# Patient Record
Sex: Male | Born: 1950 | Race: White | Hispanic: No | Marital: Married | Smoking: Never smoker
Health system: Southern US, Community
[De-identification: ages and names within clinical notes are randomized; demographics above are authoritative.]

## PROBLEM LIST (undated history)

## (undated) DIAGNOSIS — E119 Type 2 diabetes mellitus without complications: Secondary | ICD-10-CM

## (undated) DIAGNOSIS — E785 Hyperlipidemia, unspecified: Secondary | ICD-10-CM

## (undated) DIAGNOSIS — I10 Essential (primary) hypertension: Secondary | ICD-10-CM

## (undated) HISTORY — DX: Essential (primary) hypertension: I10

## (undated) HISTORY — DX: Hyperlipidemia, unspecified: E78.5

## (undated) HISTORY — PX: ARM DEBRIDEMENT: SHX890

## (undated) HISTORY — DX: Type 2 diabetes mellitus without complications: E11.9

---

## 2005-03-02 ENCOUNTER — Encounter: Admission: RE | Admit: 2005-03-02 | Discharge: 2005-03-02 | Payer: Self-pay | Admitting: Sports Medicine

## 2005-03-29 ENCOUNTER — Encounter: Admission: RE | Admit: 2005-03-29 | Discharge: 2005-03-29 | Payer: Self-pay | Admitting: Orthopedic Surgery

## 2005-04-15 ENCOUNTER — Encounter: Admission: RE | Admit: 2005-04-15 | Discharge: 2005-04-15 | Payer: Self-pay | Admitting: Orthopedic Surgery

## 2007-04-03 ENCOUNTER — Encounter: Admission: RE | Admit: 2007-04-03 | Discharge: 2007-04-03 | Payer: Self-pay | Admitting: Sports Medicine

## 2007-04-18 ENCOUNTER — Encounter: Admission: RE | Admit: 2007-04-18 | Discharge: 2007-04-18 | Payer: Self-pay | Admitting: Sports Medicine

## 2007-11-21 ENCOUNTER — Encounter: Admission: RE | Admit: 2007-11-21 | Discharge: 2007-11-21 | Payer: Self-pay | Admitting: Sports Medicine

## 2007-12-05 ENCOUNTER — Encounter: Admission: RE | Admit: 2007-12-05 | Discharge: 2007-12-05 | Payer: Self-pay | Admitting: Sports Medicine

## 2007-12-26 ENCOUNTER — Encounter: Admission: RE | Admit: 2007-12-26 | Discharge: 2007-12-26 | Payer: Self-pay | Admitting: Sports Medicine

## 2008-01-23 ENCOUNTER — Encounter: Admission: RE | Admit: 2008-01-23 | Discharge: 2008-01-23 | Payer: Self-pay | Admitting: Sports Medicine

## 2008-12-12 ENCOUNTER — Emergency Department (HOSPITAL_COMMUNITY): Admission: EM | Admit: 2008-12-12 | Discharge: 2008-12-12 | Payer: Self-pay | Admitting: Emergency Medicine

## 2009-08-27 IMAGING — CR DG CHEST 1V
1 series · 1 of 1 positions shown · non-contrast
Comparison: None

CLINICAL DATA: Dizziness

CHEST - 1 VIEW

[view not recorded]
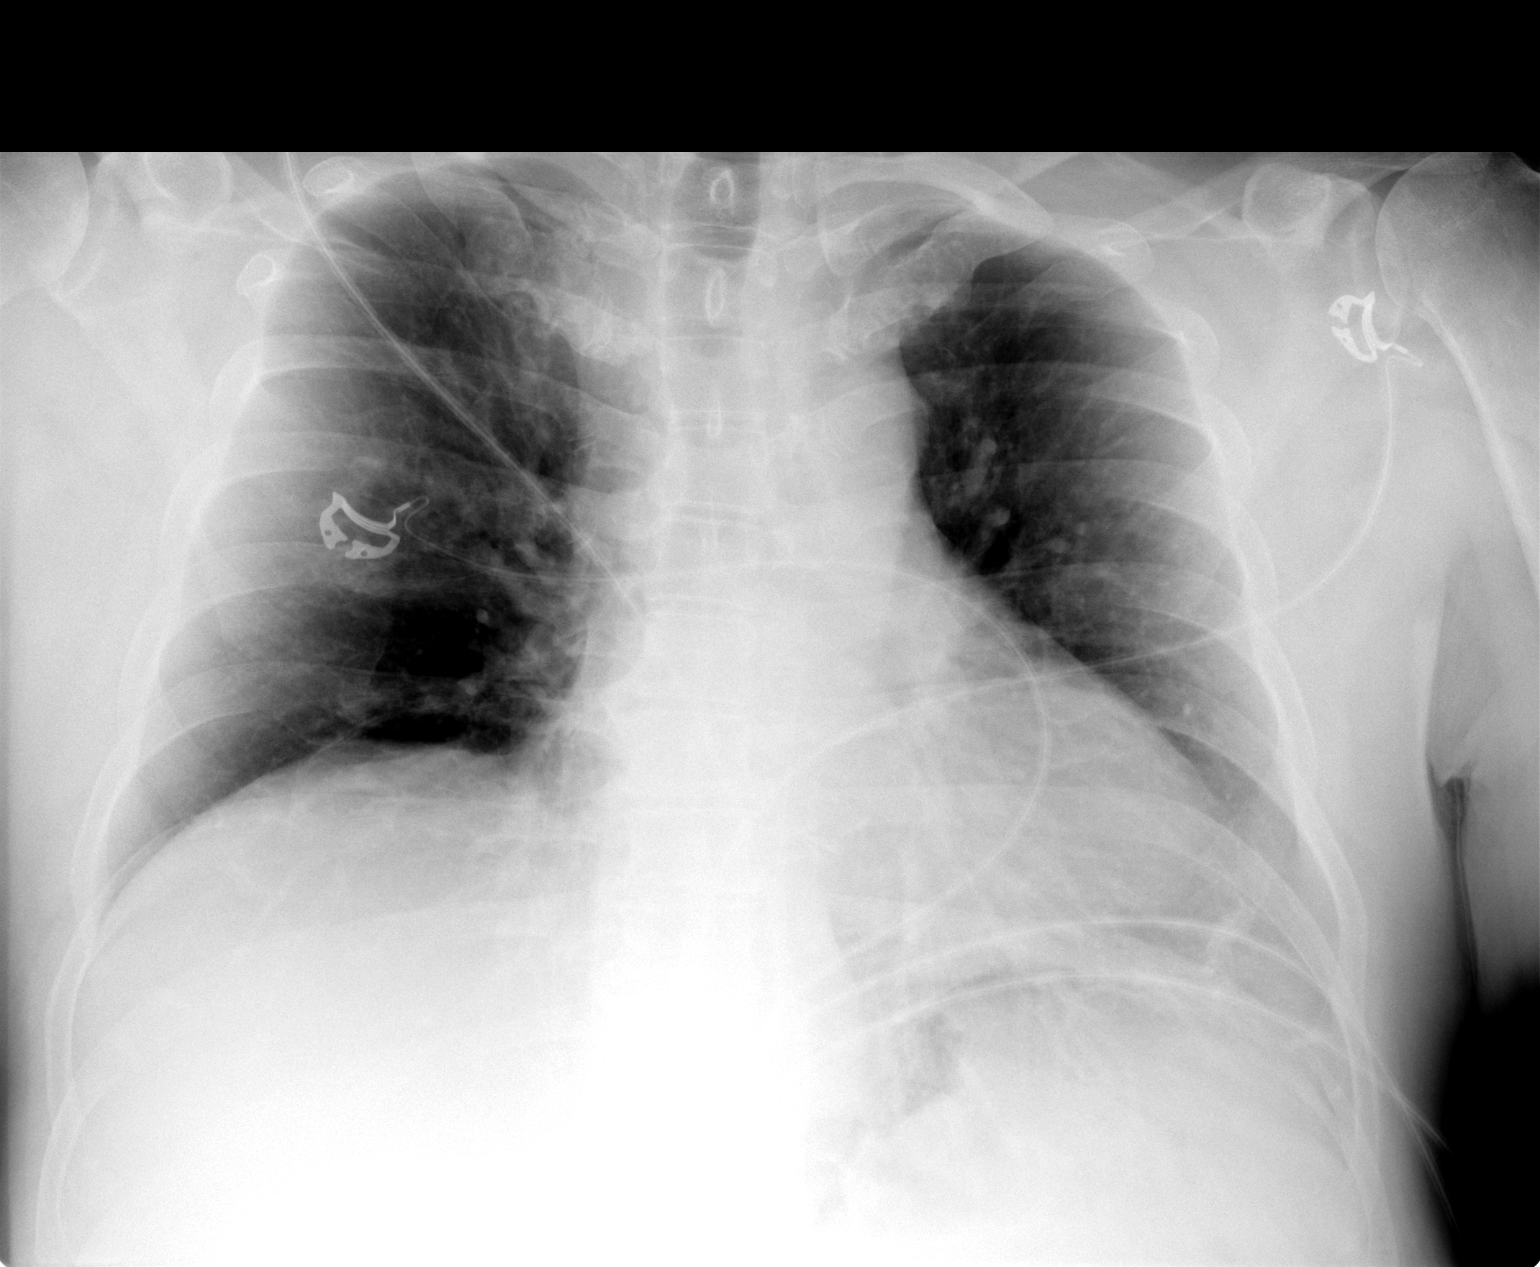

[1 of 1 positions shown; findings below may reference images not displayed]

FINDINGS: The heart size and mediastinal contours are within normal
limits.  Both lungs are clear.  The visualized skeletal structures
are unremarkable.
IMPRESSION: No acute findings.

## 2009-08-27 IMAGING — CT CT HEAD W/O CM
1 series · 16 of 30 positions shown, 20 images · non-contrast
Comparison: None

CLINICAL DATA: Dizziness.

CT HEAD WITHOUT CONTRAST
TECHNIQUE: Contiguous axial images were obtained from the base of
the skull through the vertex without contrast

[Series 2: headseq 4.8 h37s · axial · 0.50mm/px · z∈[+93,+251]mm · 16 of 36 slices shown, 20 images]
[im 2/36  brain]
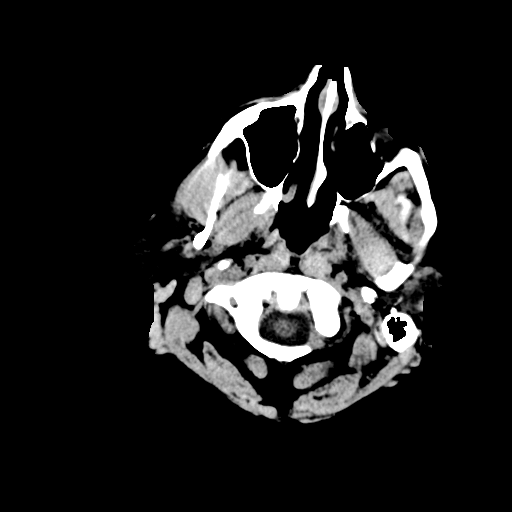
[im 2/36  bone]
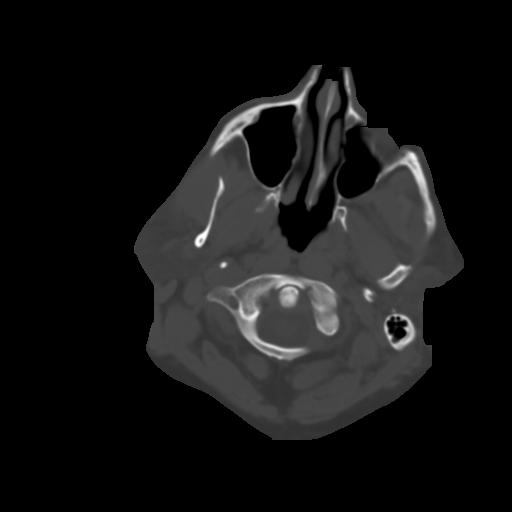
[im 4/36  brain]
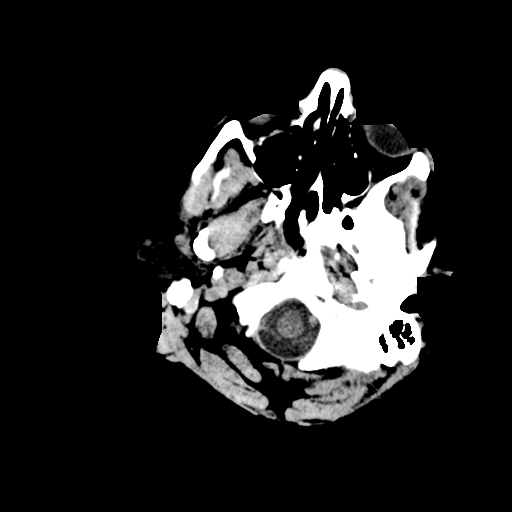
[im 7/36  brain]
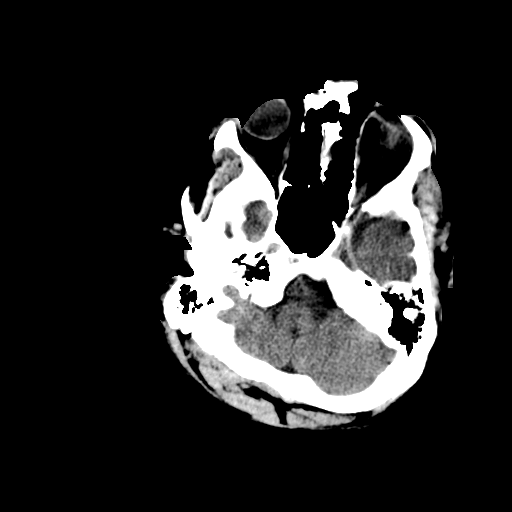
[im 9/36  brain]
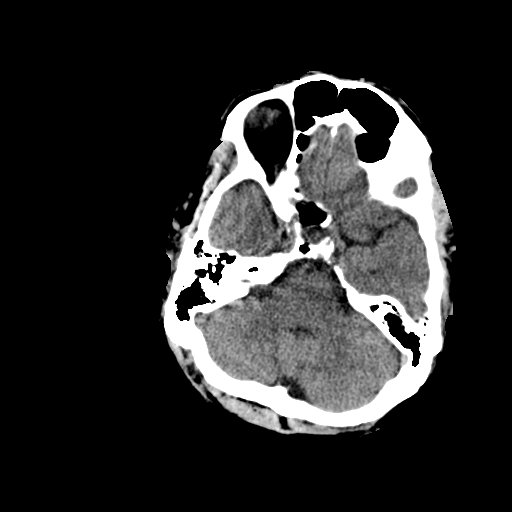
[im 10/36  brain]
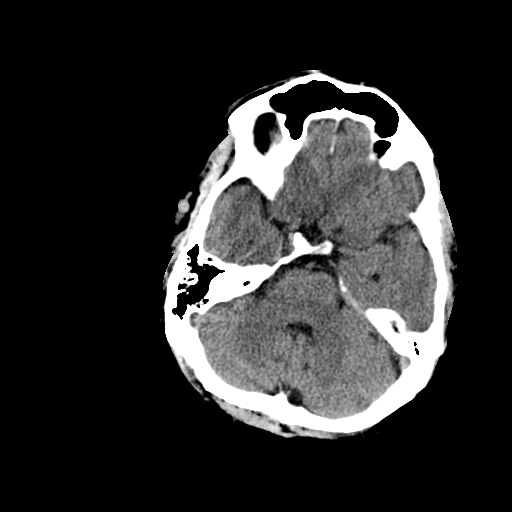
[im 10/36  bone]
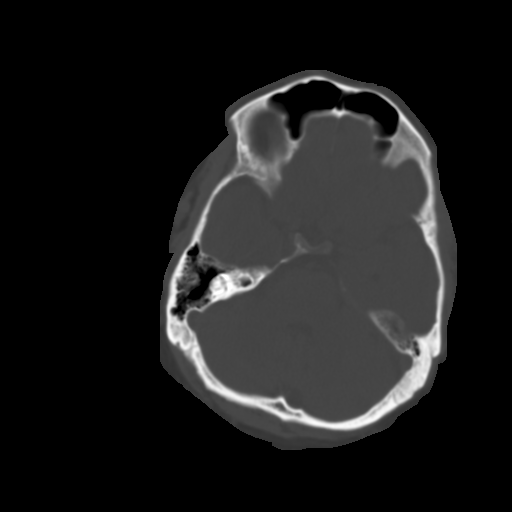
[im 13/36  brain]
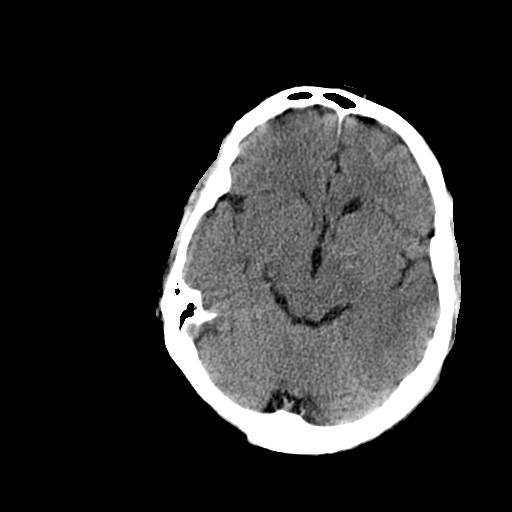
[im 15/36  brain]
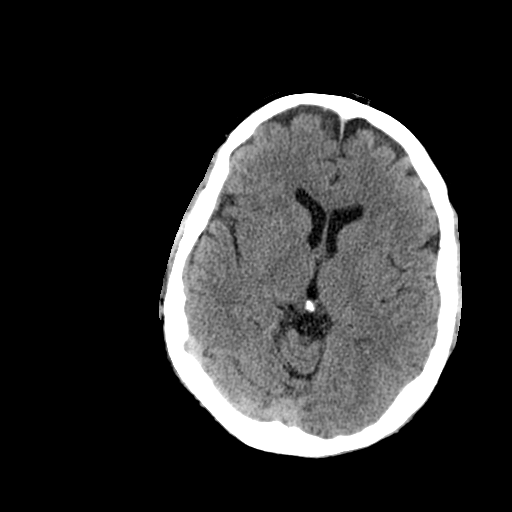
[im 17/36  brain]
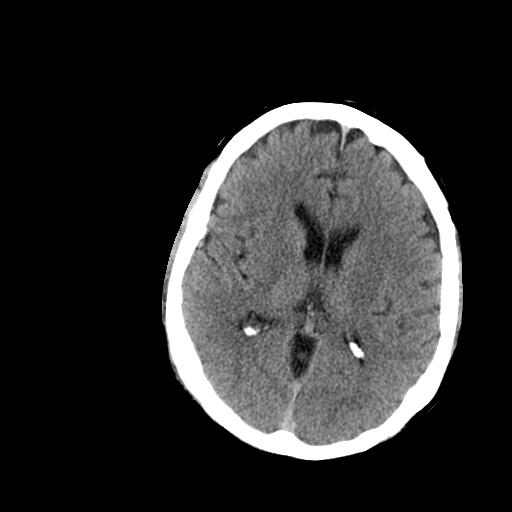
[im 19/36  brain]
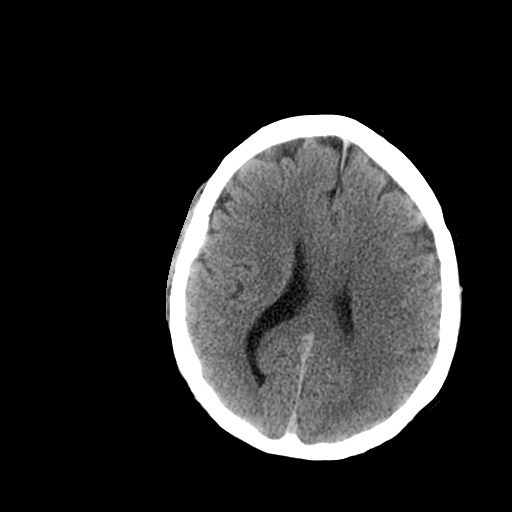
[im 19/36  bone]
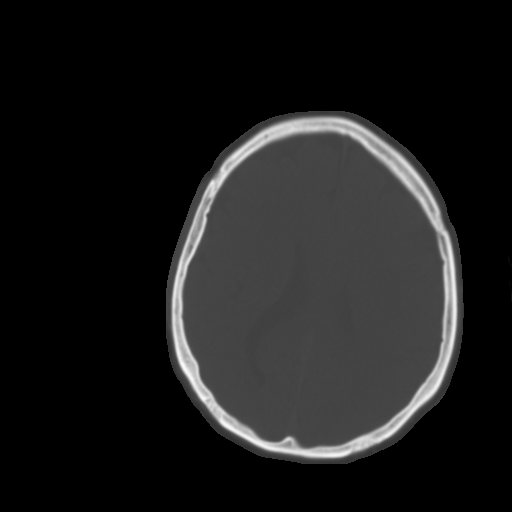
[im 21/36  brain]
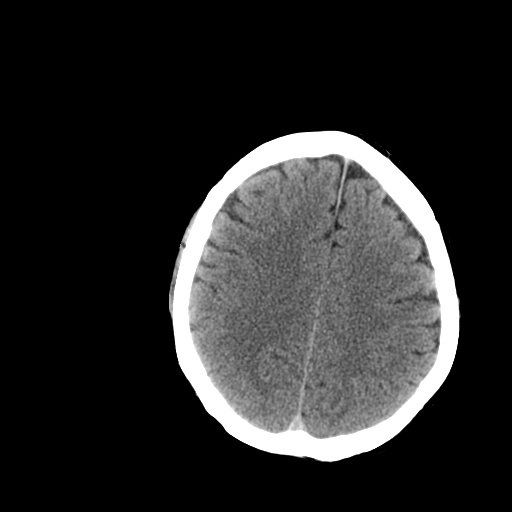
[im 23/36  brain]
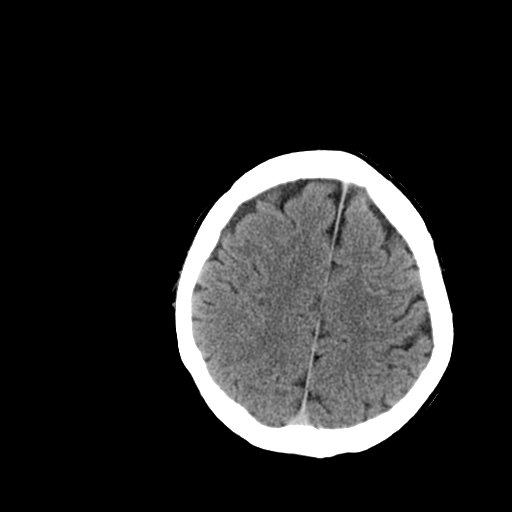
[im 26/36  brain]
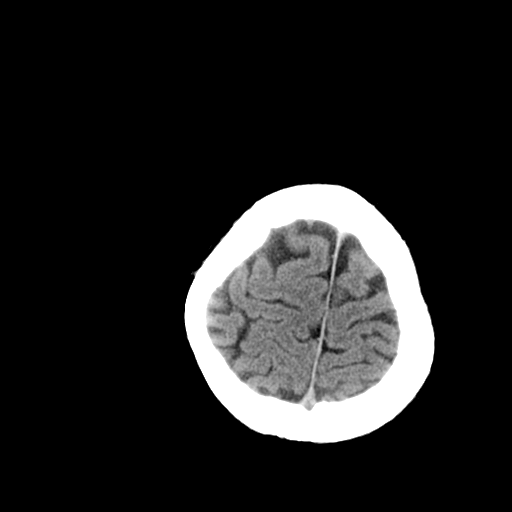
[im 27/36  brain]
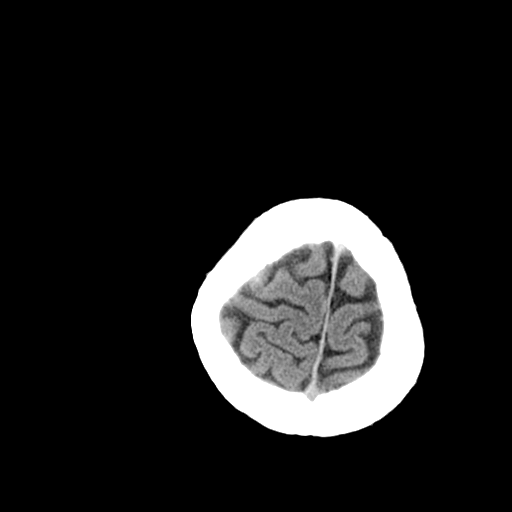
[im 27/36  bone]
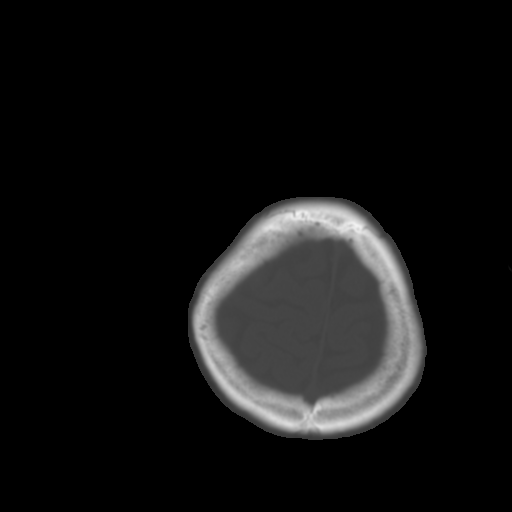
[im 29/36  brain]
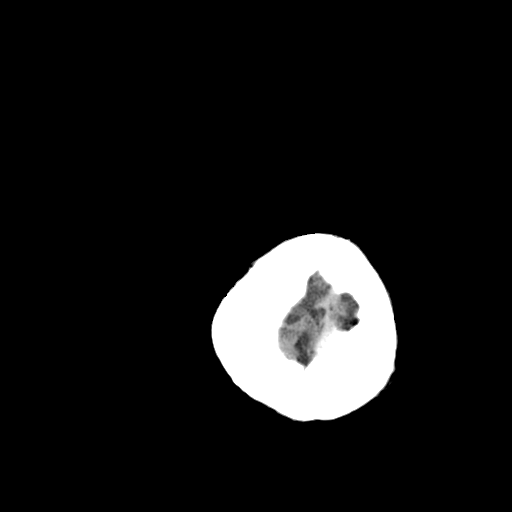
[im 32/36  brain]
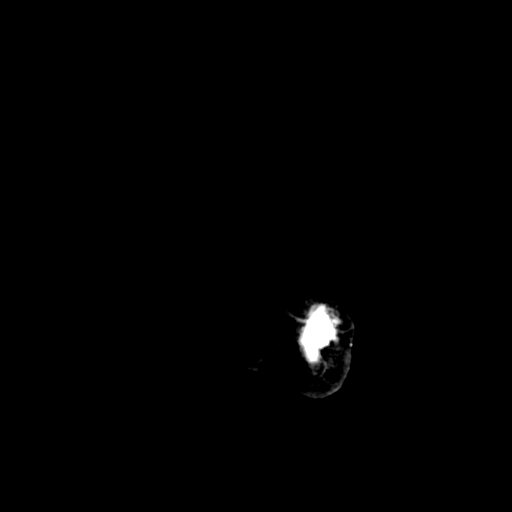
[im 34/36  brain]
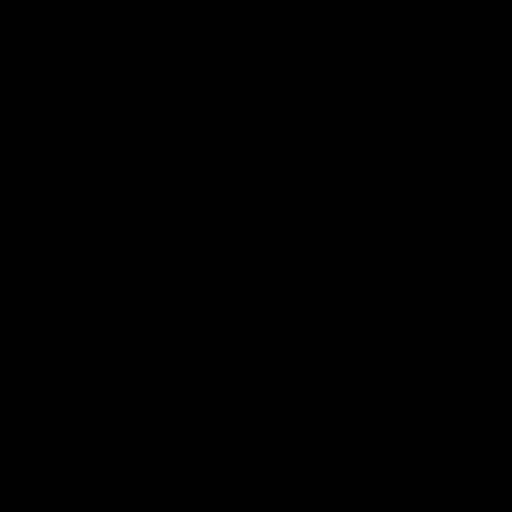

[16 of 30 positions shown; findings below may reference images not displayed]

FINDINGS: The brain has a normal appearance without evidence for
hemorrhage, acute infarction, hydrocephalus, or mass lesion.  There
is no extra axial fluid collection.  The skull and paranasal
sinuses are normal.
IMPRESSION: Normal CT of the head without contrast.

## 2010-08-21 ENCOUNTER — Encounter: Payer: Self-pay | Admitting: Orthopedic Surgery

## 2010-08-22 ENCOUNTER — Encounter: Payer: Self-pay | Admitting: Sports Medicine

## 2010-11-09 LAB — COMPREHENSIVE METABOLIC PANEL
ALT: 26 U/L (ref 0–53)
AST: 21 U/L (ref 0–37)
Alkaline Phosphatase: 81 U/L (ref 39–117)
Calcium: 9.4 mg/dL (ref 8.4–10.5)
Creatinine, Ser: 1.05 mg/dL (ref 0.4–1.5)
Glucose, Bld: 141 mg/dL — ABNORMAL HIGH (ref 70–99)
Sodium: 136 mEq/L (ref 135–145)
Total Bilirubin: 0.6 mg/dL (ref 0.3–1.2)
Total Protein: 7 g/dL (ref 6.0–8.3)

## 2010-11-09 LAB — CBC
HCT: 46.9 % (ref 39.0–52.0)
MCHC: 35.3 g/dL (ref 30.0–36.0)
Platelets: 212 10*3/uL (ref 150–400)
RBC: 5.55 MIL/uL (ref 4.22–5.81)
RDW: 13.7 % (ref 11.5–15.5)

## 2010-11-09 LAB — DIFFERENTIAL
Basophils Relative: 1 % (ref 0–1)
Eosinophils Relative: 2 % (ref 0–5)
Lymphs Abs: 3.6 10*3/uL (ref 0.7–4.0)

## 2010-11-09 LAB — BRAIN NATRIURETIC PEPTIDE: Pro B Natriuretic peptide (BNP): <30 pg/mL (ref 0.0–100.0)

## 2010-11-09 LAB — POCT CARDIAC MARKERS

## 2012-02-22 DIAGNOSIS — N529 Male erectile dysfunction, unspecified: Secondary | ICD-10-CM | POA: Insufficient documentation

## 2017-09-12 DIAGNOSIS — N401 Enlarged prostate with lower urinary tract symptoms: Secondary | ICD-10-CM | POA: Insufficient documentation

## 2023-03-02 ENCOUNTER — Encounter: Payer: Self-pay | Admitting: Family Medicine

## 2023-03-02 ENCOUNTER — Ambulatory Visit (INDEPENDENT_AMBULATORY_CARE_PROVIDER_SITE_OTHER): Payer: Medicare PPO | Admitting: Family Medicine

## 2023-03-02 VITALS — BP 144/82 | HR 97 | Temp 97.6°F | Ht 69.0 in | Wt 185.0 lb

## 2023-03-02 DIAGNOSIS — E118 Type 2 diabetes mellitus with unspecified complications: Secondary | ICD-10-CM

## 2023-03-02 DIAGNOSIS — Z23 Encounter for immunization: Secondary | ICD-10-CM | POA: Diagnosis not present

## 2023-03-02 LAB — CBC WITH DIFFERENTIAL/PLATELET
Absolute Monocytes: 461 cells/uL (ref 200–950)
Basophils Absolute: 80 cells/uL (ref 0–200)
Basophils Relative: 1.5 %
Eosinophils Absolute: 90 cells/uL (ref 15–500)
Eosinophils Relative: 1.7 %
HCT: 42.9 % (ref 38.5–50.0)
Hemoglobin: 14.6 g/dL (ref 13.2–17.1)
Lymphs Abs: 806 cells/uL — ABNORMAL LOW (ref 850–3900)
MCH: 28.2 pg (ref 27.0–33.0)
MCHC: 34 g/dL (ref 32.0–36.0)
MCV: 82.8 fL (ref 80.0–100.0)
MPV: 10.7 fL (ref 7.5–12.5)
Monocytes Relative: 8.7 %
Neutro Abs: 3864 cells/uL (ref 1500–7800)
Neutrophils Relative %: 72.9 %
Platelets: 202 10*3/uL (ref 140–400)
RBC: 5.18 10*6/uL (ref 4.20–5.80)
RDW: 12.9 % (ref 11.0–15.0)
Total Lymphocyte: 15.2 %
WBC: 5.3 10*3/uL (ref 3.8–10.8)

## 2023-03-02 MED ORDER — LOSARTAN POTASSIUM 100 MG PO TABS: 100.0000 mg | ORAL_TABLET | Freq: Every day | ORAL | 3 refills | Status: DC

## 2023-03-02 NOTE — Addendum Note (Signed)
Addended by: Venia Carbon K on: 03/02/2023 09:43 AM   Modules accepted: Orders

## 2023-03-02 NOTE — Progress Notes (Signed)
Subjective:    Patient ID: Carlos Medina, male    DOB: [birth year], 72 y.o.   MRN: [id number]  HPI  Patient is a very pleasant 72 year old Caucasian gentleman here today to establish care.  He is a Runner, broadcasting/film/video.  He has a history of borderline diabetes mellitus type 2 as well as hypertension.  He is due for the pneumonia vaccine, the shingles vaccine.  His prostate is being monitored by urologist.  He has never had a colonoscopy.  He declines a colonoscopy.  We discussed Cologuard and he will think about this.  He is currently diet controlled with regards to his diabetes.  He denies pain shortness of breath or dyspnea on exertion.  He is not currently on statin No past medical history on file.  Current Outpatient Medications on File Prior to Visit  Medication Sig Dispense Refill   ACCU-CHEK GUIDE test strip USE TO TEST BLOOD GLUCOSE TWICE DAILY     Accu-Chek Softclix Lancets lancets USE TO TEST BLOOD SUGAR TWICE DAILY     aspirin EC 81 MG tablet Take by mouth.     doxazosin (CARDURA) 8 MG tablet Take 8 mg by mouth at bedtime.     Multiple Vitamin (MULTI-VITAMIN) tablet Take 1 tablet by mouth daily.     sildenafil (VIAGRA) 100 MG tablet Take by mouth. (Patient not taking: Reported on 03/02/2023)     No current facility-administered medications on file prior to visit.   Allergies  Allergen Reactions   Albuterol Other (See Comments)    "makes me bounce off the wall"   Metformin And Related     Fatigue diarrhea   Social History   Socioeconomic History   Marital status: Married    Spouse name: Not on file   Number of children: Not on file   Years of education: Not on file   Highest education level: Not on file  Occupational History   Not on file  Tobacco Use   Smoking status: Never   Smokeless tobacco: Never  Substance and Sexual Activity   Alcohol use: Not on file   Drug use: Not on file   Sexual activity: Not on file  Other Topics Concern   Not on file  Social History Narrative    Not on file   Social Determinants of Health   Financial Resource Strain: Not on file  Food Insecurity: Not on file  Transportation Needs: Not on file  Physical Activity: Not on file  Stress: Not on file  Social Connections: Not on file  Intimate Partner Violence: Not on file     Review of Systems  All other systems reviewed and are negative.      Objective:   Physical Exam Vitals reviewed.  Constitutional:      Appearance: Normal appearance. He is normal weight.  Neck:     Vascular: No carotid bruit.  Cardiovascular:     Rate and Rhythm: Normal rate and regular rhythm.     Pulses: Normal pulses.     Heart sounds: Normal heart sounds. No murmur heard.    No friction rub. No gallop.  Pulmonary:     Effort: Pulmonary effort is normal. No respiratory distress.     Breath sounds: Normal breath sounds. No stridor. No wheezing, rhonchi or rales.  Abdominal:     General: Abdomen is flat. Bowel sounds are normal. There is no distension.     Palpations: Abdomen is soft.     Tenderness: There is no abdominal tenderness.  There is no guarding or rebound.  Musculoskeletal:     Right lower leg: No edema.     Left lower leg: No edema.  Neurological:     General: No focal deficit present.     Mental Status: He is alert and oriented to person, place, and time. Mental status is at baseline.     Cranial Nerves: No cranial nerve deficit.           Assessment & Plan:  Controlled type 2 diabetes mellitus with complication, without long-term current use of insulin (HCC) - Plan: CT CARDIAC SCORING (SELF PAY ONLY), CBC with Differential/Platelet, COMPLETE METABOLIC PANEL WITH GFR, Lipid panel, Hemoglobin A1c Recommended Pneumovax 23 which she received today.  Recommended the shingles vaccine.  Recommended a colonoscopy which she politely declined.  Recommended Cologuard which she will consider.  Deferred his prostate/PSA to his urologist.  Blood sugars elevated today but he states it  is better controlled at home typically in the 20/80.  Strongly recommended a statin given his history of diabetes.  Patient is hesitant to take a statin.  We discussed a coronary artery calcium score and he would like to get this test to rule stratify himself to determine if he wants to take a statin

## 2023-03-03 ENCOUNTER — Other Ambulatory Visit: Payer: Self-pay

## 2023-03-03 MED ORDER — ROSUVASTATIN CALCIUM 10 MG PO TABS: 10.0000 mg | ORAL_TABLET | Freq: Every day | ORAL | 3 refills | Status: DC

## 2023-03-03 MED ORDER — EMPAGLIFLOZIN 25 MG PO TABS: 25.0000 mg | ORAL_TABLET | Freq: Every day | ORAL | 3 refills | Status: DC

## 2023-04-13 ENCOUNTER — Ambulatory Visit (INDEPENDENT_AMBULATORY_CARE_PROVIDER_SITE_OTHER): Payer: Medicare PPO

## 2023-04-13 VITALS — Ht 69.0 in | Wt 185.0 lb

## 2023-04-13 DIAGNOSIS — Z0000 Encounter for general adult medical examination without abnormal findings: Secondary | ICD-10-CM

## 2023-04-13 NOTE — Patient Instructions (Signed)
Mr. Ditter , Thank you for taking time to come for your Medicare Wellness Visit. I appreciate your ongoing commitment to your health goals. Please review the following plan we discussed and let me know if I can assist you in the future.   Referrals/Orders/Follow-Ups/Clinician Recommendations: Aim for 30 minutes of exercise or brisk walking, 6-8 glasses of water, and 5 servings of fruits and vegetables each day.  This is a list of the screening recommended for you and due dates:  Health Maintenance  Topic Date Due   Yearly kidney health urinalysis for diabetes  Never done   Hepatitis C Screening  Never done   DTaP/Tdap/Td vaccine (1 - Tdap) Never done   COVID-19 Vaccine (1 - 2023-24 season) Never done   Zoster (Shingles) Vaccine (1 of 2) 06/02/2023*   Flu Shot  10/30/2023*   Colon Cancer Screening  03/01/2024*   Yearly kidney function blood test for diabetes  03/01/2024   Pneumonia Vaccine (2 of 2 - PCV) 03/01/2024   Medicare Annual Wellness Visit  04/12/2024   HPV Vaccine  Aged Out  *Topic was postponed. The date shown is not the original due date.    Advanced directives: (ACP Link)Information on Advanced Care Planning can be found at [facility] Advance Health Care Directives Advance Health Care Directives ([email or web address]   Next Medicare Annual Wellness Visit scheduled for next year: Yes

## 2023-04-13 NOTE — Progress Notes (Signed)
Subjective:   Carlos Medina is a 72 y.o. male who presents for an Initial Medicare Annual Wellness Visit.  Visit Complete: Virtual  I connected with  Carlos Medina on 04/13/23 by a audio enabled telemedicine application and verified that I am speaking with the correct person using two identifiers.  Patient Location: Home  Provider Location: Home Office  I discussed the limitations of evaluation and management by telemedicine. The patient expressed understanding and agreed to proceed.  Vital Signs: Because this visit was a virtual/telehealth visit, some criteria may be missing or patient reported. Any vitals not documented were not able to be obtained and vitals that have been documented are patient reported.   Review of Systems     Cardiac Risk Factors include: advanced age (>1men, >34 women);diabetes mellitus;dyslipidemia;hypertension;male gender     Objective:    Today's Vitals   04/13/23 1123  Weight: 185 lb (83.9 kg)  Height: 5\' 9"  (1.753 m)   Body mass index is 27.32 kg/m.     04/13/2023   11:33 AM  Advanced Directives  Does Patient Have a Medical Advance Directive? No  Would patient like information on creating a medical advance directive? Yes (MAU/Ambulatory/Procedural Areas - Information given)    Current Medications (verified) Outpatient Encounter Medications as of 04/13/2023  Medication Sig   ACCU-CHEK GUIDE test strip USE TO TEST BLOOD GLUCOSE TWICE DAILY   Accu-Chek Softclix Lancets lancets USE TO TEST BLOOD SUGAR TWICE DAILY   aspirin EC 81 MG tablet Take by mouth.   doxazosin (CARDURA) 8 MG tablet Take 8 mg by mouth at bedtime.   empagliflozin (JARDIANCE) 25 MG TABS tablet Take 1 tablet (25 mg total) by mouth daily before breakfast.   losartan (COZAAR) 100 MG tablet Take 1 tablet (100 mg total) by mouth daily.   Multiple Vitamin (MULTI-VITAMIN) tablet Take 1 tablet by mouth daily.   rosuvastatin (CRESTOR) 10 MG tablet Take 1 tablet (10 mg total) by  mouth daily.   sildenafil (VIAGRA) 100 MG tablet Take by mouth. (Patient not taking: Reported on 04/13/2023)   No facility-administered encounter medications on file as of 04/13/2023.    Allergies (verified) Albuterol and Metformin and related   History: History reviewed. No pertinent past medical history. Past Surgical History:  Procedure Laterality Date   ARM DEBRIDEMENT Left    removal of bone fragments   History reviewed. No pertinent family history. 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    Social Determinants of Health   Financial Resource Strain: Low Risk  (04/13/2023)   Overall Financial Resource Strain (CARDIA)    Difficulty of Paying Living Expenses: Not hard at all  Food Insecurity: No Food Insecurity (04/13/2023)   Hunger Vital Sign    Worried About Running Out of Food in the Last Year: Never true    Ran Out of Food in  the Last Year: Never true  Transportation Needs: No Transportation Needs (04/13/2023)   PRAPARE - Administrator, Civil Service (Medical): No    Lack of Transportation (Non-Medical): No  Physical Activity: Sufficiently Active (04/13/2023)   Exercise Vital Sign    Days of Exercise per Week: 5 days    Minutes of Exercise per Session: 30 min  Stress: No Stress Concern Present (04/13/2023)   [employer] of Occupational Health - Occupational Stress Questionnaire    Feeling of Stress : Not at all  Social Connections: Socially Integrated  (04/13/2023)   Social Connection and Isolation Panel [NHANES]    Frequency of Communication with Friends and Family: More than three times a week    Frequency of Social Gatherings with Friends and Family: Three times a week    Attends Religious Services: More than 4 times per year    Active Member of Clubs or Organizations: Yes    Attends Engineer, structural: More than 4 times per year    Marital Status: Married    Tobacco Counseling Counseling given: Not Answered   Clinical Intake:  Pre-visit preparation completed: Yes  Pain : No/denies pain     Diabetes: Yes CBG done?: No Did pt. bring in CBG monitor from home?: No  How often do you need to have someone help you when you read instructions, pamphlets, or other written materials from your doctor or pharmacy?: 1 - Never  Interpreter Needed?: No  Information entered by :: Kandis Fantasia LPN   Activities of Daily Living    04/13/2023   11:31 AM  In your present state of health, do you have any difficulty performing the following activities:  Hearing? 0  Vision? 0  Difficulty concentrating or making decisions? 0  Walking or climbing stairs? 0  Dressing or bathing? 0  Doing errands, shopping? 0  Preparing Food and eating ? N  Using the Toilet? N  In the past six months, have you accidently leaked urine? N  Do you have problems with loss of bowel control? N  Managing your Medications? N  Managing your Finances? N  Housekeeping or managing your Housekeeping? N    Patient Care Team: Donita Brooks, MD as PCP - General (Family Medicine)  Indicate any recent Medical Services you may have received from other than [facility] providers in the past year (date may be approximate).     Assessment:   This is a routine wellness examination for Carlos Medina.  Hearing/Vision screen Hearing Screening - Comments:: Denies hearing difficulties   Vision Screening - Comments:: No vision problems; will schedule routine eye exam  soon     Goals Addressed             This Visit's Progress    Remain active and independent        Depression Screen    04/13/2023   11:32 AM 03/02/2023    8:21 AM  PHQ 2/9 Scores  PHQ - 2 Score 0 0  PHQ- 9 Score 0     Fall Risk    04/13/2023   11:33 AM 03/02/2023    8:21 AM  Fall Risk   Falls in the past year? 0 0  Number falls in past yr: 0 0  Injury with Fall? 0 0  Risk for fall due to : No Fall Risks No Fall Risks  Follow up Falls prevention discussed;Education provided;Falls evaluation completed Falls prevention discussed    MEDICARE RISK AT HOME: Medicare Risk at  Home Any stairs in or around the home?: No If so, are there any without handrails?: No Home free of loose throw rugs in walkways, pet beds, electrical cords, etc?: Yes Adequate lighting in your home to reduce risk of falls?: Yes Life alert?: No Use of a cane, walker or w/c?: No Grab bars in the bathroom?: Yes Shower chair or bench in shower?: No Elevated toilet seat or a handicapped toilet?: No  TIMED UP AND GO:  Was the test performed? No    Cognitive Function:        04/13/2023   11:33 AM  6CIT Screen  What Year? 0 points  What month? 0 points  What time? 0 points  Count back from 20 0 points  Months in reverse 0 points  Repeat phrase 0 points  Total Score 0 points    Immunizations Immunization History  Administered Date(s) Administered   Pneumococcal Polysaccharide-23 03/02/2023    TDAP status: Due, Education has been provided regarding the importance of this vaccine. Advised may receive this vaccine at local pharmacy or Health Dept. Aware to provide a copy of the vaccination record if obtained from local pharmacy or Health Dept. Verbalized acceptance and understanding.  Flu Vaccine status: Due, Education has been provided regarding the importance of this vaccine. Advised may receive this vaccine at local pharmacy or Health Dept. Aware to provide a copy of the vaccination record if  obtained from local pharmacy or Health Dept. Verbalized acceptance and understanding.  Pneumococcal vaccine status: Up to date  Covid-19 vaccine status: Information provided on how to obtain vaccines.   Qualifies for Shingles Vaccine? Yes   Zostavax completed No   Shingrix Completed?: No.    Education has been provided regarding the importance of this vaccine. Patient has been advised to call insurance company to determine out of pocket expense if they have not yet received this vaccine. Advised may also receive vaccine at local pharmacy or Health Dept. Verbalized acceptance and understanding.  Screening Tests Health Maintenance  Topic Date Due   Diabetic kidney evaluation - Urine ACR  Never done   Hepatitis C Screening  Never done   DTaP/Tdap/Td (1 - Tdap) Never done   COVID-19 Vaccine (1 - 2023-24 season) Never done   Zoster Vaccines- Shingrix (1 of 2) 06/02/2023 (Originally 01/04/1999)   INFLUENZA VACCINE  10/30/2023 (Originally 03/02/2023)   Colonoscopy  03/01/2024 (Originally 01/03/1994)   Diabetic kidney evaluation - eGFR measurement  03/01/2024   Pneumonia Vaccine 10+ Years old (2 of 2 - PCV) 03/01/2024   Medicare Annual Wellness (AWV)  04/12/2024   HPV VACCINES  Aged Out    Health Maintenance  Health Maintenance Due  Topic Date Due   Diabetic kidney evaluation - Urine ACR  Never done   Hepatitis C Screening  Never done   DTaP/Tdap/Td (1 - Tdap) Never done   COVID-19 Vaccine (1 - 2023-24 season) Never done    Colorectal cancer screening:  Patient declines at this time   Lung Cancer Screening: (Low Dose CT Chest recommended if Age 76-80 years, 20 pack-year currently smoking OR have quit w/in 15years.) does not qualify.   Lung Cancer Screening Referral: n/a  Additional Screening:  Hepatitis C Screening: does qualify  Vision Screening: Recommended annual ophthalmology exams for early detection of glaucoma and other disorders of the eye. Is the patient up to date with  their annual eye exam?  No  Who is the provider or what is the name of the office in which  the patient attends annual eye exams? none If pt is not established with a provider, would they like to be referred to a provider to establish care? No .   Dental Screening: Recommended annual dental exams for proper oral hygiene  Community Resource Referral / Chronic Care Management: CRR required this visit?  No   CCM required this visit?  No    Plan:     I have personally reviewed and noted the following in the patient's chart:   Medical and social history Use of alcohol, tobacco or illicit drugs  Current medications and supplements including opioid prescriptions. Patient is not currently taking opioid prescriptions. Functional ability and status Nutritional status Physical activity Advanced directives List of other physicians Hospitalizations, surgeries, and ER visits in previous 12 months Vitals Screenings to include cognitive, depression, and falls Referrals and appointments  In addition, I have reviewed and discussed with patient certain preventive protocols, quality metrics, and best practice recommendations. A written personalized care plan for preventive services as well as general preventive health recommendations were provided to patient.     Kandis Fantasia [place], [place]   1/61/0960   After Visit Summary: (Mail) Due to this being a telephonic visit, the after visit summary with patients personalized plan was offered to patient via mail   Nurse Notes: No concerns at this time

## 2023-04-18 ENCOUNTER — Ambulatory Visit (HOSPITAL_COMMUNITY)
Admission: RE | Admit: 2023-04-18 | Discharge: 2023-04-18 | Disposition: A | Discharge status: Home / Self Care | Payer: Medicare PPO | Source: Ambulatory Visit | Attending: Family Medicine | Admitting: Family Medicine

## 2023-04-18 DIAGNOSIS — E118 Type 2 diabetes mellitus with unspecified complications: Secondary | ICD-10-CM

## 2023-05-24 ENCOUNTER — Other Ambulatory Visit: Payer: Self-pay | Admitting: Family Medicine

## 2023-05-25 NOTE — Telephone Encounter (Signed)
Requested Prescriptions  Pending Prescriptions Disp Refills   empagliflozin (JARDIANCE) 25 MG TABS tablet [Pharmacy Med Name: JARDIANCE 25MG  TABLETS] 90 tablet 0    Sig: TAKE 1 TABLET(25 MG) BY MOUTH DAILY BEFORE BREAKFAST     Endocrinology:  Diabetes - SGLT2 Inhibitors Failed - 05/24/2023  8:05 AM      Failed - Valid encounter within last 6 months    Recent Outpatient Visits   None            Passed - Cr in normal range and within 360 days    Creat  Date Value Ref Range Status  03/02/2023 1.26 0.70 - 1.28 mg/dL Final         Passed - HBA1C is between 0 and 7.9 and within 180 days    Hgb A1c MFr Bld  Date Value Ref Range Status  03/02/2023 7.2 (H) <5.7 % of total Hgb Final    Comment:    For someone without known diabetes, a hemoglobin A1c value of 6.5% or greater indicates that they may have  diabetes and this should be confirmed with a follow-up  test. . For someone with known diabetes, a value <7% indicates  that their diabetes is well controlled and a value  greater than or equal to 7% indicates suboptimal  control. A1c targets should be individualized based on  duration of diabetes, age, comorbid conditions, and  other considerations. . Currently, no consensus exists regarding use of hemoglobin A1c for diagnosis of diabetes for children. .          Passed - eGFR in normal range and within 360 days    GFR calc Af Amer  Date Value Ref Range Status  12/12/2008  >60 mL/min Final   >60        The eGFR has been calculated using the MDRD equation. This calculation has not been validated in all clinical situations. eGFR's persistently <60 mL/min signify possible Chronic Kidney Disease.   GFR calc non Af Amer  Date Value Ref Range Status  12/12/2008 >60 >60 mL/min Final   eGFR  Date Value Ref Range Status  03/02/2023 60 > OR = 60 mL/min/1.61m2 Final

## 2023-06-02 ENCOUNTER — Other Ambulatory Visit: Payer: Medicare PPO

## 2023-06-02 DIAGNOSIS — N401 Enlarged prostate with lower urinary tract symptoms: Secondary | ICD-10-CM

## 2023-06-02 DIAGNOSIS — E1169 Type 2 diabetes mellitus with other specified complication: Secondary | ICD-10-CM

## 2023-06-02 DIAGNOSIS — E118 Type 2 diabetes mellitus with unspecified complications: Secondary | ICD-10-CM

## 2023-06-03 LAB — COMPLETE METABOLIC PANEL WITH GFR
AG Ratio: 2 (calc) (ref 1.0–2.5)
ALT: 11 U/L (ref 9–46)
AST: 7 U/L — ABNORMAL LOW (ref 10–35)
Albumin: 4.5 g/dL (ref 3.6–5.1)
Alkaline phosphatase (APISO): 79 U/L (ref 35–144)
BUN: 24 mg/dL (ref 7–25)
CO2: 28 mmol/L (ref 20–32)
Calcium: 9.5 mg/dL (ref 8.6–10.3)
Chloride: 101 mmol/L (ref 98–110)
Creat: 1.27 mg/dL (ref 0.70–1.28)
Globulin: 2.2 g/dL (ref 1.9–3.7)
Glucose, Bld: 140 mg/dL — ABNORMAL HIGH (ref 65–99)
Potassium: 4.4 mmol/L (ref 3.5–5.3)
Sodium: 139 mmol/L (ref 135–146)
Total Bilirubin: 0.5 mg/dL (ref 0.2–1.2)
Total Protein: 6.7 g/dL (ref 6.1–8.1)
eGFR: 59 mL/min/{1.73_m2} — ABNORMAL LOW (ref >=60)

## 2023-06-03 LAB — CBC WITH DIFFERENTIAL/PLATELET
Absolute Lymphocytes: 1083 {cells}/uL (ref 850–3900)
Absolute Monocytes: 414 {cells}/uL (ref 200–950)
Basophils Absolute: 62 {cells}/uL (ref 0–200)
Basophils Relative: 0.9 %
Eosinophils Absolute: 110 {cells}/uL (ref 15–500)
Eosinophils Relative: 1.6 %
HCT: 46 % (ref 38.5–50.0)
Hemoglobin: 14.9 g/dL (ref 13.2–17.1)
MCH: 27.4 pg (ref 27.0–33.0)
MCHC: 32.4 g/dL (ref 32.0–36.0)
MCV: 84.7 fL (ref 80.0–100.0)
MPV: 10.4 fL (ref 7.5–12.5)
Monocytes Relative: 6 %
Neutro Abs: 5230 {cells}/uL (ref 1500–7800)
Neutrophils Relative %: 75.8 %
Platelets: 214 10*3/uL (ref 140–400)
RBC: 5.43 10*6/uL (ref 4.20–5.80)
RDW: 13.1 % (ref 11.0–15.0)
Total Lymphocyte: 15.7 %
WBC: 6.9 10*3/uL (ref 3.8–10.8)

## 2023-06-03 LAB — HEMOGLOBIN A1C
Hgb A1c MFr Bld: 6.9 %{Hb} — ABNORMAL HIGH (ref <5.7)
Mean Plasma Glucose: 151 mg/dL
eAG (mmol/L): 8.4 mmol/L

## 2023-06-03 LAB — PROTEIN / CREATININE RATIO, URINE
Creatinine, Urine: 85 mg/dL (ref 20–320)
Protein/Creat Ratio: 129 mg/g{creat} (ref 25–148)
Protein/Creatinine Ratio: 0.129 mg/mg{creat} (ref 0.025–0.148)
Total Protein, Urine: 11 mg/dL (ref 5–25)

## 2023-06-03 LAB — LIPID PANEL
Cholesterol: 124 mg/dL (ref <200)
HDL: 35 mg/dL — ABNORMAL LOW (ref >=40)
LDL Cholesterol (Calc): 71 mg/dL
Non-HDL Cholesterol (Calc): 89 mg/dL (ref <130)
Total CHOL/HDL Ratio: 3.5 (calc) (ref <5.0)
Triglycerides: 95 mg/dL (ref <150)

## 2023-06-28 ENCOUNTER — Other Ambulatory Visit: Payer: Self-pay | Admitting: Family Medicine

## 2023-06-28 NOTE — Telephone Encounter (Signed)
Requested Prescriptions  Pending Prescriptions Disp Refills   JARDIANCE 25 MG TABS tablet [Pharmacy Med Name: JARDIANCE 25MG  TABLETS] 90 tablet 0    Sig: TAKE 1 TABLET(25 MG) BY MOUTH DAILY BEFORE BREAKFAST     Endocrinology:  Diabetes - SGLT2 Inhibitors Failed - 06/28/2023  8:05 AM      Failed - eGFR in normal range and within 360 days    GFR calc Af Amer  Date Value Ref Range Status  12/12/2008  >60 mL/min Final   >60        The eGFR has been calculated using the MDRD equation. This calculation has not been validated in all clinical situations. eGFR's persistently <60 mL/min signify possible Chronic Kidney Disease.   GFR calc non Af Amer  Date Value Ref Range Status  12/12/2008 >60 >60 mL/min Final   eGFR  Date Value Ref Range Status  06/02/2023 59 (L) > OR = 60 mL/min/1.47m2 Final         Failed - Valid encounter within last 6 months    Recent Outpatient Visits   None            Passed - Cr in normal range and within 360 days    Creat  Date Value Ref Range Status  06/02/2023 1.27 0.70 - 1.28 mg/dL Final   Creatinine, Urine  Date Value Ref Range Status  06/02/2023 85 20 - 320 mg/dL Final         Passed - HBA1C is between 0 and 7.9 and within 180 days    Hgb A1c MFr Bld  Date Value Ref Range Status  06/02/2023 6.9 (H) <5.7 % of total Hgb Final    Comment:    For someone without known diabetes, a hemoglobin A1c value of 6.5% or greater indicates that they may have  diabetes and this should be confirmed with a follow-up  test. . For someone with known diabetes, a value <7% indicates  that their diabetes is well controlled and a value  greater than or equal to 7% indicates suboptimal  control. A1c targets should be individualized based on  duration of diabetes, age, comorbid conditions, and  other considerations. . Currently, no consensus exists regarding use of hemoglobin A1c for diagnosis of diabetes for children. Carlos Medina

## 2023-09-28 ENCOUNTER — Other Ambulatory Visit: Payer: Self-pay | Admitting: Family Medicine

## 2023-09-30 ENCOUNTER — Other Ambulatory Visit: Payer: Self-pay | Admitting: Family Medicine

## 2023-10-27 ENCOUNTER — Ambulatory Visit: Admitting: Family Medicine

## 2023-10-27 ENCOUNTER — Encounter: Payer: Self-pay | Admitting: Family Medicine

## 2023-10-27 VITALS — BP 152/90 | HR 75 | Temp 97.7°F | Ht 69.0 in | Wt 185.0 lb

## 2023-10-27 DIAGNOSIS — Z7984 Long term (current) use of oral hypoglycemic drugs: Secondary | ICD-10-CM | POA: Diagnosis not present

## 2023-10-27 DIAGNOSIS — H9193 Unspecified hearing loss, bilateral: Secondary | ICD-10-CM | POA: Diagnosis not present

## 2023-10-27 DIAGNOSIS — E118 Type 2 diabetes mellitus with unspecified complications: Secondary | ICD-10-CM

## 2023-10-27 MED ORDER — ROSUVASTATIN CALCIUM 10 MG PO TABS: 10.0000 mg | ORAL_TABLET | Freq: Every day | ORAL | 3 refills | Status: AC

## 2023-10-27 MED ORDER — EMPAGLIFLOZIN 25 MG PO TABS: 25.0000 mg | ORAL_TABLET | Freq: Every day | ORAL | 1 refills | Status: DC

## 2023-10-27 NOTE — Progress Notes (Signed)
 Subjective:    Patient ID: Carlos Medina, male    DOB: 06/29/1951, 73 y.o.   MRN: [id number]  HPI Patient's blood pressure is elevated today however he states that he has well-documented whitecoat syndrome.  He checks his blood pressure consistently at home and his blood pressure is always less than 140 systolic.  He states that his diastolic blood pressure is usually in the 70s.  His fasting blood sugars in the morning around 140.  His 2-hour postprandial sugars in the afternoon are under 120.  He denies any hypoglycemic episodes.  Diabetic foot exam was performed today and is normal.  Patient states that he has not seen an eye doctor in years.  He denies any neuropathy.  He denies any blurry vision.  He is requesting a referral to an audiologist for hearing loss.  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  Due to noise trauma, the patient has a longstanding history of hearing loss and he would like to be evaluated for hearing aids. Past Medical History:  Diagnosis Date   Diabetes mellitus without complication (HCC)    Hyperlipidemia      Current Outpatient Medications on File Prior to Visit  Medication Sig Dispense Refill   ACCU-CHEK GUIDE test strip USE TO TEST BLOOD GLUCOSE TWICE DAILY     Accu-Chek Softclix Lancets lancets USE TO TEST BLOOD SUGAR TWICE DAILY     aspirin EC 81 MG tablet Take by mouth.     doxazosin (CARDURA) 8 MG tablet Take 8 mg by mouth at bedtime.     JARDIANCE 25 MG TABS tablet TAKE 1 TABLET(25 MG) BY MOUTH DAILY BEFORE BREAKFAST 90 tablet 0   losartan (COZAAR) 100 MG tablet Take 1 tablet (100 mg total) by mouth daily. 90 tablet 3   Multiple Vitamin (MULTI-VITAMIN) tablet Take 1 tablet by mouth daily.     rosuvastatin (CRESTOR) 10 MG tablet Take 1 tablet (10 mg total) by mouth daily. 90 tablet 3   sildenafil (VIAGRA) 100 MG tablet Take by mouth. (Patient not taking: Reported on 04/13/2023)     No current  facility-administered medications on file prior to visit.   Allergies  Allergen Reactions   Albuterol Other (See Comments)    "makes me bounce off the wall"   Metformin And Related     Fatigue diarrhea   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    Social Drivers of Health   Financial Resource Strain: Low Risk  (10/20/2023)   Overall Financial Resource Strain (CARDIA)    Difficulty of Paying Living Expenses: Not hard at all  Food Insecurity: No Food Insecurity (10/20/2023)   Hunger Vital Sign    Worried About Running Out of Food in the Last Year: Never true    Ran  Out of Food in the Last Year: Never true  Transportation Needs: No Transportation Needs (10/20/2023)   PRAPARE - Administrator, Civil Service (Medical): No    Lack of Transportation (Non-Medical): No  Physical Activity: Insufficiently Active (10/20/2023)   Exercise Vital Sign    Days of Exercise per Week: 3 days    Minutes of Exercise per Session: 30 min  Stress: No Stress Concern Present (10/20/2023)   [employer] of Occupational Health - Occupational Stress Questionnaire    Feeling of Stress : Only a little  Social Connections: Socially Integrated (10/20/2023)   Social Connection and Isolation Panel [NHANES]    Frequency of Communication with Friends and Family: Twice a week    Frequency of Social Gatherings with Friends and Family: Once a week    Attends Religious Services:  More than 4 times per year    Active Member of [employer] or Organizations: No    Attends Engineer, structural: More than 4 times per year    Marital Status: Married  Catering manager Violence: Not At Risk (04/13/2023)   Humiliation, Afraid, Rape, and Kick questionnaire    Fear of Current or Ex-Partner: No    Emotionally Abused: No    Physically Abused: No    Sexually Abused: No     Review of Systems  All other systems reviewed and are negative.      Objective:   Physical Exam Vitals reviewed.  Constitutional:      Appearance: Normal appearance. He is normal weight.  HENT:     Right Ear: Tympanic membrane and ear canal normal.     Left Ear: Tympanic membrane and ear canal normal.  Neck:     Vascular: No carotid bruit.  Cardiovascular:     Rate and Rhythm: Normal rate and regular rhythm.     Pulses: Normal pulses.     Heart sounds: Normal heart sounds. No murmur heard.    No friction rub. No gallop.  Pulmonary:     Effort: Pulmonary effort is normal. No respiratory distress.     Breath sounds: Normal breath sounds. No stridor. No wheezing, rhonchi or rales.  Abdominal:     General: Abdomen is flat. Bowel sounds are normal. There is no distension.     Palpations: Abdomen is soft.     Tenderness: There is no abdominal tenderness. There is no guarding or rebound.  Musculoskeletal:     Right lower leg: No edema.     Left lower leg: No edema.  Neurological:     General: No focal deficit present.     Mental Status: He is alert and oriented to person, place, and time. Mental status is at baseline.     Cranial Nerves: No cranial nerve deficit.           Assessment & Plan:  Controlled type 2 diabetes mellitus with complication, without long-term current use of insulin (HCC) - Plan: rosuvastatin (CRESTOR) 10 MG tablet, empagliflozin (JARDIANCE) 25 MG TABS tablet, Hemoglobin A1c, COMPLETE METABOLIC PANEL WITHOUT GFR, Lipid panel, Microalbumin/Creatinine Ratio,  Urine  Bilateral hearing loss, unspecified hearing loss type - Plan: Ambulatory referral to Audiology Blood pressure is elevated today but this is whitecoat syndrome.  Patient will continue to monitor his blood pressure at home and notify me if his blood pressures greater than 140/90.  Recommended returning fasting for CMP, fasting lipid panel, hemoglobin A1c, and urine protein creatinine ratio.  I would like to see his A1c less than  6.5 and his LDL cholesterol less than 70.  Strongly recommended that he schedule an appointment with his eye doctor for diabetic eye exam.  I will happily make a referral to an audiologist as the patient has noticeable hearing loss.

## 2023-11-01 ENCOUNTER — Ambulatory Visit: Attending: Family Medicine | Admitting: Audiologist

## 2023-11-01 DIAGNOSIS — H903 Sensorineural hearing loss, bilateral: Secondary | ICD-10-CM | POA: Insufficient documentation

## 2023-11-01 NOTE — Procedures (Signed)
  Outpatient Rehabilitation and [facility] [address] [place], [place] [address] [phone]  AUDIOLOGICAL EVALUATION  Name: Carlos Medina    Status: Outpatient DOB: Jun 15, 1951    Referent: Donita Brooks, MD MRN: [id number] Date: 11/01/2023     Diagnosis: H90.3   HISTORY: [name], age 73 y.o. years, was seen for an audiological evaluation.   [name] notices increased difficulty hearing in daily environments. He is a previous hearing aid user, but didn't notice a lot of benefit and the hearing aids have since stopped working. He is accompanied by his wife who reports that [name] often misunderstands or does not hear her.  Jaimes reports significant history of noise exposure, both in the [employer] and recreationally. Natasha notes no ear pain, pressure, fullness, or tinnitus. There is no report of history of ear surgery or ear infections.          EVALUATION: Otoscopic inspection reveals clear ear canals with visible tympanic membranes.   Standard audiometric techniques were used to obtain thresholds under headphones. Speech reception thresholds are 15 dBHL on the left and 20 dBHL on the right using recorded spondee word lists. Word recognition was 60% at 55 dBHL on the left at and 56% at 60 dBHL on the right using recorded NU-6 word lists, in quiet.  A mild sloping to severe sensorineural hearing loss is present bilaterally.   Tympanometry was completed to assess middle ear status. Normal, Type A tympanograms were obtained bilaterally.   CONCLUSION:  [name] has a mild sloping to severe high frequency sensorineural hearing loss bilaterally.  Word recognition is poor in quiet at conversational speech levels bilaterally. Findings were reviewed with the patient. A list of local hearing aid providers was given to the patient. He may also consider looking into [place] benefits for hearing aids.   RECOMMENDATIONS: Use of binaural amplification to assist in daily listening  situations. Use of communication strategies to improve communication in difficult listening situations. Use of hearing protection in noisy situations. Repeat hearing evaluation as medically indicated   Helane Rima, Au.D., CCC-A Doctor of Audiology 11/01/2023  cc: Donita Brooks, MD

## 2023-12-19 ENCOUNTER — Telehealth: Payer: Self-pay

## 2023-12-19 NOTE — Telephone Encounter (Signed)
 Copied from CRM [id number]. Topic: Appointments - Appointment Scheduling >> Dec 19, 2023  1:31 PM Fonda T wrote: Patient/patient representative is calling to schedule an appointment. Refer to attachments for appointment information.   Lab appointment scheduled for patient. Viewed patient chart, most recent labs ordered on 10/27/23. Please update if need for upcoming diabetic lab appointment.

## 2023-12-26 ENCOUNTER — Other Ambulatory Visit

## 2023-12-26 ENCOUNTER — Other Ambulatory Visit: Payer: Self-pay | Admitting: Family Medicine

## 2023-12-26 DIAGNOSIS — E118 Type 2 diabetes mellitus with unspecified complications: Secondary | ICD-10-CM

## 2023-12-26 NOTE — Telephone Encounter (Signed)
 Prescription Request  12/26/2023  LOV: 10/27/2023  What is the name of the medication or equipment?   hydrochlorothiazide (HYDRODIURIL) 25 MG tablet [[id number]]  DISCONTINUED   JARDIANCE  25 MG TABS tablet [[id number]]  DISCONTINUED   ACCU-CHEK GUIDE test strip [[id number]]   Have you contacted your pharmacy to request a refill? Yes   Which pharmacy would you like this sent to?  [id number] - [place], [place] - [address] [place] [address] Phone: [phone] Fax: [phone]    Patient notified that their request is being sent to the clinical staff for review and that they should receive a response within 2 business days.   Please advise patient at [phone].

## 2023-12-26 NOTE — Telephone Encounter (Signed)
 ACCU-CHEK GUIDE test strip [[id number]] is not available for re order, please advise. Jardiance  25 mg and hydrochlorothiazide has been sent to [facility] for refill.

## 2023-12-27 LAB — TEST AUTHORIZATION

## 2023-12-27 LAB — COMPREHENSIVE METABOLIC PANEL WITH GFR
AG Ratio: 2 (calc) (ref 1.0–2.5)
ALT: 12 U/L (ref 9–46)
AST: 8 U/L — ABNORMAL LOW (ref 10–35)
Albumin: 4.7 g/dL (ref 3.6–5.1)
Alkaline phosphatase (APISO): 74 U/L (ref 35–144)
BUN/Creatinine Ratio: 22 (calc) (ref 6–22)
BUN: 27 mg/dL — ABNORMAL HIGH (ref 7–25)
CO2: 26 mmol/L (ref 20–32)
Calcium: 9.7 mg/dL (ref 8.6–10.3)
Chloride: 99 mmol/L (ref 98–110)
Creat: 1.21 mg/dL (ref 0.70–1.28)
Globulin: 2.3 g/dL (ref 1.9–3.7)
Glucose, Bld: 146 mg/dL — ABNORMAL HIGH (ref 65–99)
Potassium: 4.2 mmol/L (ref 3.5–5.3)
Sodium: 136 mmol/L (ref 135–146)
Total Bilirubin: 0.3 mg/dL (ref 0.2–1.2)
Total Protein: 7 g/dL (ref 6.1–8.1)
eGFR: 63 mL/min/{1.73_m2} (ref >=60)

## 2023-12-27 LAB — CBC WITH DIFFERENTIAL/PLATELET
Absolute Lymphocytes: 1031 {cells}/uL (ref 850–3900)
Absolute Monocytes: 405 {cells}/uL (ref 200–950)
Basophils Absolute: 49 {cells}/uL (ref 0–200)
Basophils Relative: 0.9 %
Eosinophils Absolute: 113 {cells}/uL (ref 15–500)
Eosinophils Relative: 2.1 %
HCT: 45.9 % (ref 38.5–50.0)
Hemoglobin: 15.3 g/dL (ref 13.2–17.1)
MCH: 28 pg (ref 27.0–33.0)
MCHC: 33.3 g/dL (ref 32.0–36.0)
MCV: 83.9 fL (ref 80.0–100.0)
MPV: 10.5 fL (ref 7.5–12.5)
Monocytes Relative: 7.5 %
Neutro Abs: 3802 {cells}/uL (ref 1500–7800)
Neutrophils Relative %: 70.4 %
Platelets: 186 10*3/uL (ref 140–400)
RBC: 5.47 10*6/uL (ref 4.20–5.80)
RDW: 13 % (ref 11.0–15.0)
Total Lymphocyte: 19.1 %
WBC: 5.4 10*3/uL (ref 3.8–10.8)

## 2023-12-27 LAB — HEMOGLOBIN A1C
Hgb A1c MFr Bld: 7.1 % — ABNORMAL HIGH (ref <5.7)
Mean Plasma Glucose: 157 mg/dL
eAG (mmol/L): 8.7 mmol/L

## 2023-12-27 LAB — LIPID PANEL
Cholesterol: 118 mg/dL (ref <200)
HDL: 43 mg/dL (ref >=40)
LDL Cholesterol (Calc): 59 mg/dL
Non-HDL Cholesterol (Calc): 75 mg/dL (ref <130)
Total CHOL/HDL Ratio: 2.7 (calc) (ref <5.0)
Triglycerides: 82 mg/dL (ref <150)

## 2023-12-27 LAB — HEPATITIS C ANTIBODY: Hepatitis C Ab: NONREACTIVE

## 2023-12-29 ENCOUNTER — Other Ambulatory Visit

## 2024-01-03 ENCOUNTER — Other Ambulatory Visit: Payer: Self-pay

## 2024-01-03 DIAGNOSIS — E118 Type 2 diabetes mellitus with unspecified complications: Secondary | ICD-10-CM

## 2024-01-03 MED ORDER — ACCU-CHEK GUIDE TEST VI STRP: ORAL_STRIP | 12 refills | Status: AC

## 2024-01-04 ENCOUNTER — Other Ambulatory Visit: Payer: Self-pay | Admitting: Family Medicine

## 2024-01-04 MED ORDER — HYDROCHLOROTHIAZIDE 25 MG PO TABS
25.0000 mg | ORAL_TABLET | Freq: Every day | ORAL | 3 refills | Status: AC
Start: 2024-01-04 — End: ?

## 2024-04-11 ENCOUNTER — Telehealth: Payer: Self-pay

## 2024-04-11 NOTE — Telephone Encounter (Signed)
 Copied from CRM #[id number]. Topic: Clinical - Request for Lab/Test Order >> Apr 11, 2024  9:51 AM Carlos Medina wrote: Pt believes he will be due for labs in November. Please contact him to confirm and submit orders

## 2024-04-11 NOTE — Telephone Encounter (Signed)
 MyChart msg sent.

## 2024-04-11 NOTE — Telephone Encounter (Signed)
 Would like to know when labs are due and to schedule an appointment for them.

## 2024-04-18 ENCOUNTER — Ambulatory Visit: Payer: Medicare PPO | Admitting: *Deleted

## 2024-04-18 VITALS — Ht 68.0 in | Wt 179.0 lb

## 2024-04-18 DIAGNOSIS — Z0000 Encounter for general adult medical examination without abnormal findings: Secondary | ICD-10-CM | POA: Diagnosis not present

## 2024-04-18 NOTE — Progress Notes (Signed)
 Subjective:   Carlos Medina is a 73 y.o. male who presents for Medicare Annual/Subsequent preventive examination.  Visit Complete: Virtual I connected with  Damek D Doby on 04/18/24 by a audio enabled telemedicine application and verified that I am speaking with the correct person using two identifiers.  Patient Location: Home  Provider Location: Home Office  I discussed the limitations of evaluation and management by telemedicine. The patient expressed understanding and agreed to proceed.  Vital Signs: Because this visit was a virtual/telehealth visit, some criteria may be missing or patient reported. Any vitals not documented were not able to be obtained and vitals that have been documented are patient reported.   Cardiac Risk Factors include: diabetes mellitus;male gender;advanced age (>68men, >71 women);obesity (BMI >30kg/m2);hypertension     Objective:    Today's Vitals   04/18/24 1153  Weight: 179 lb (81.2 kg)  Height: 5' 8 (1.727 m)   Body mass index is 27.22 kg/m.     04/18/2024   11:52 AM 04/13/2023   11:33 AM  Advanced Directives  Does Patient Have a Medical Advance Directive? Yes No  Type of Scientist, research (life sciences) of Healthcare Power of Attorney in Chart? No - copy requested   Would patient like information on creating a medical advance directive?  Yes (MAU/Ambulatory/Procedural Areas - Information given)    Current Medications (verified) Outpatient Encounter Medications as of 04/18/2024  Medication Sig   Accu-Chek Softclix Lancets lancets USE TO TEST BLOOD SUGAR TWICE DAILY   aspirin EC 81 MG tablet Take by mouth.   doxazosin (CARDURA) 8 MG tablet Take 8 mg by mouth at bedtime.   empagliflozin  (JARDIANCE ) 25 MG TABS tablet Take 1 tablet (25 mg total) by mouth daily.   glucose blood (ACCU-CHEK GUIDE TEST) test strip Use to check blood sugars up to 4 times per day.   hydrochlorothiazide  (HYDRODIURIL ) 25 MG tablet Take 1  tablet (25 mg total) by mouth daily.   losartan  (COZAAR ) 100 MG tablet Take 1 tablet (100 mg total) by mouth daily.   Multiple Vitamin (MULTI-VITAMIN) tablet Take 1 tablet by mouth daily.   rosuvastatin  (CRESTOR ) 10 MG tablet Take 1 tablet (10 mg total) by mouth daily.   sildenafil (VIAGRA) 100 MG tablet Take by mouth.   No facility-administered encounter medications on file as of 04/18/2024.    Allergies (verified) Albuterol and Metformin and related   History: Past Medical History:  Diagnosis Date   Diabetes mellitus without complication (HCC)    Hyperlipidemia    Hypertension    Past Surgical History:  Procedure Laterality Date   ARM DEBRIDEMENT Left    removal of bone fragments   No family history on file. 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    Social Drivers of Corporate investment banker Strain: Low  Risk  (04/18/2024)   Overall Financial Resource Strain (CARDIA)    Difficulty of Paying Living Expenses: Not hard at all  Food Insecurity: No Food Insecurity (04/18/2024)   Hunger Vital Sign    Worried About Running Out of Food in the Last Year: Never true    Ran Out of Food in the Last Year: Never true  Transportation Needs: No Transportation Needs (04/18/2024)   PRAPARE - Administrator, Civil Service (Medical): No    Lack of Transportation (Non-Medical): No  Physical Activity: Insufficiently Active (04/18/2024)   Exercise Vital  Sign    Days of Exercise per Week: 3 days    Minutes of Exercise per Session: 30 min  Stress: No Stress Concern Present (04/18/2024)   [employer] of Occupational Health - Occupational Stress Questionnaire    Feeling of Stress: Not at all  Social Connections: Socially Integrated (04/18/2024)   Social Connection and Isolation Panel    Frequency of Communication with Friends and Family: More than three times a week    Frequency of Social Gatherings with Friends and Family: More than three times a week    Attends Religious Services: More than 4 times per year    Active Member of [employer] or Organizations: Yes    Attends Engineer, structural: More than 4 times per year    Marital Status: Married    Tobacco Counseling Counseling given: Not Answered   Clinical Intake:  Pre-visit preparation completed: Yes  Pain : No/denies pain     Diabetes: Yes CBG done?: No Did pt. bring in CBG monitor from home?: No  How often do you need to have someone help you when you read instructions, pamphlets, or other written materials from your doctor or pharmacy?: 1 - Never  Interpreter Needed?: No  Information entered by :: Mliss Graff LPN   Activities of Daily Living    04/18/2024   11:53 AM 04/14/2024    8:52 AM  In your present state of health, do you have any difficulty performing the following activities:  Hearing? 1 0  Vision? 0 0  Difficulty concentrating or making decisions? 0 0  Walking or climbing stairs? 0 0  Dressing or bathing? 0 0  Doing errands, shopping? 0 0  Preparing Food and eating ? N N  Using the Toilet? N N  In the past six months, have you accidently leaked urine? N N  Do you have problems with loss of bowel control? N N  Managing your Medications? N N  Managing your Finances? N N  Housekeeping or managing your Housekeeping? N N    Patient Care Team: Duanne Butler DASEN, MD as PCP - General (Family Medicine)  Indicate any recent Medical Services you  may have received from other than [facility] providers in the past year (date may be approximate).     Assessment:   This is a routine wellness examination for [name].  Hearing/Vision screen Hearing Screening - Comments:: Not wearing hearing aids Does not wear Vision Screening - Comments:: Not up to date   Goals Addressed             This Visit's Progress    Remain active and independent   On track    Weight (lb) < 200 lb (90.7 kg)   179 lb (81.2 kg)      Depression Screen    04/18/2024   11:57 AM 04/13/2023   11:32 AM 03/02/2023    8:21 AM  PHQ 2/9  Scores  PHQ - 2 Score 0 0 0  PHQ- 9 Score 0 0     Fall Risk    04/18/2024   11:52 AM 04/14/2024    8:52 AM 04/13/2023   11:33 AM 03/02/2023    8:21 AM  Fall Risk   Falls in the past year? 0 0 0 0  Number falls in past yr: 0 0 0 0  Injury with Fall? 0 0 0 0  Risk for fall due to :   No Fall Risks No Fall Risks  Follow up Falls evaluation completed;Education provided;Falls prevention discussed  Falls prevention discussed;Education provided;Falls evaluation completed Falls prevention discussed    MEDICARE RISK AT HOME: Medicare Risk at Home Any stairs in or around the home?: No If so, are there any without handrails?: No Home free of loose throw rugs in walkways, pet beds, electrical cords, etc?: Yes Adequate lighting in your home to reduce risk of falls?: Yes Life alert?: No Use of a cane, walker or w/c?: No Grab bars in the bathroom?: Yes Shower chair or bench in shower?: Yes Elevated toilet seat or a handicapped toilet?: Yes  TIMED UP AND GO:  Was the test performed?  No    Cognitive Function:        04/18/2024   11:55 AM 04/13/2023   11:33 AM  6CIT Screen  What Year? 0 points 0 points  What month? 0 points 0 points  What time? 0 points 0 points  Count back from 20 0 points 0 points  Months in reverse 0 points 0 points  Repeat phrase 0 points 0 points  Total Score 0 points 0 points     Immunizations Immunization History  Administered Date(s) Administered   Influenza-Unspecified 04/24/2023   Moderna Sars-Covid-2 Vaccination 02/22/2020, 07/20/2020   Pneumococcal Polysaccharide-23 03/02/2023   Zoster Recombinant(Shingrix) 05/19/2023, 08/08/2023    TDAP status: Due, Education has been provided regarding the importance of this vaccine. Advised may receive this vaccine at local pharmacy or Health Dept. Aware to provide a copy of the vaccination record if obtained from local pharmacy or Health Dept. Verbalized acceptance and understanding.  Flu Vaccine status: Due, Education has been provided regarding the importance of this vaccine. Advised may receive this vaccine at local pharmacy or Health Dept. Aware to provide a copy of the vaccination record if obtained from local pharmacy or Health Dept. Verbalized acceptance and understanding.  Pneumococcal vaccine status: Up to date  Covid-19 vaccine status: Declined, Education has been provided regarding the importance of this vaccine but patient still declined. Advised may receive this vaccine at local pharmacy or Health Dept.or vaccine clinic. Aware to provide a copy of the vaccination record if obtained from local pharmacy or Health Dept. Verbalized acceptance and understanding.  Qualifies for Shingles Vaccine? No   Zostavax completed Yes   Shingrix Completed?: Yes  Screening Tests Health Maintenance  Topic Date Due   Diabetic kidney evaluation - Urine ACR  Never done   DTaP/Tdap/Td (1 - Tdap) Never done   Pneumococcal Vaccine: 50+ Years (2 of 2 - PCV) 03/01/2024   Influenza Vaccine  03/01/2024   Colonoscopy  04/18/2025 (Originally 01/03/1994)   Diabetic kidney evaluation - eGFR measurement  12/25/2024   Medicare Annual Wellness (AWV)  04/18/2025   Hepatitis C Screening  Completed   Zoster Vaccines- Shingrix  Completed   HPV VACCINES  Aged Out   Meningococcal B Vaccine  Aged Out   COVID-19 Vaccine  Discontinued     Health  Maintenance  Health Maintenance Due  Topic Date Due   Diabetic kidney evaluation - Urine ACR  Never done   DTaP/Tdap/Td (1 - Tdap) Never done   Pneumococcal Vaccine: 50+ Years (2 of 2 - PCV) 03/01/2024   Influenza Vaccine  03/01/2024    Colorectal cancer screening: No longer required.   Lung Cancer Screening: (Low Dose CT Chest recommended if Age 78-80 years, 20 pack-year currently smoking OR have quit w/in 15years.) does not qualify.   Lung Cancer Screening Referral:   Additional Screening:  Hepatitis C Screening  completed 2025  Vision Screening: Recommended annual ophthalmology exams for early detection of glaucoma and other disorders of the eye. Is the patient up to date with their annual eye exam?  No  Who is the provider or what is the name of the office in which the patient attends annual eye exams? Education provided If pt is not established with a provider, would they like to be referred to a provider to establish care? No .   Dental Screening: Recommended annual dental exams for proper oral hygiene  Nutrition Risk Assessment:  Has the patient had any N/V/D within the last 2 months?  No  Does the patient have any non-healing wounds?  No  Has the patient had any unintentional weight loss or weight gain?  No   Diabetes:  Is the patient diabetic?  Yes  If diabetic, was a CBG obtained today?  No  Did the patient bring in their glucometer from home?  No  How often do you monitor your CBG's? 2 x day.   Financial Strains and Diabetes Management:  Are you having any financial strains with the device, your supplies or your medication? No .  Does the patient want to be seen by Chronic Care Management for management of their diabetes?  No  Would the patient like to be referred to a Nutritionist or for Diabetic Management?  No   Diabetic Exams:  Diabetic Eye Exam: . Overdue for diabetic eye exam. Pt has been advised about the importance in completing this  exam.   Education provided  Diabetic Foot Exam: . Pt has been advised about the importance in completing this exam. .    Community Resource Referral / Chronic Care Management: CRR required this visit?  No   CCM required this visit?  No     Plan:     I have personally reviewed and noted the following in the patient's chart:   Medical and social history Use of alcohol, tobacco or illicit drugs  Current medications and supplements including opioid prescriptions. Patient is not currently taking opioid prescriptions. Functional ability and status Nutritional status Physical activity Advanced directives List of other physicians Hospitalizations, surgeries, and ER visits in previous 12 months Vitals Screenings to include cognitive, depression, and falls Referrals and appointments  In addition, I have reviewed and discussed with patient certain preventive protocols, quality metrics, and best practice recommendations. A written personalized care plan for preventive services as well as general preventive health recommendations were provided to patient.     Mliss Graff, LPN   [id number]   After Visit Summary: (MyChart) Due to this being a telephonic visit, the after visit summary with patients personalized plan was offered to patient via MyChart   Nurse Notes:

## 2024-04-18 NOTE — Patient Instructions (Signed)
 Mr. Carlos Medina , Thank you for taking time to come for your Medicare Wellness Visit. I appreciate your ongoing commitment to your health goals. Please review the following plan we discussed and let me know if I can assist you in the future.   Screening recommendations/referrals:  Recommended yearly ophthalmology/optometry visit for glaucoma screening and checkup Recommended yearly dental visit for hygiene and checkup  Vaccinations: Influenza vaccine: Education provided Pneumococcal vaccine: up to date Tdap vaccine: Education provided Shingles vaccine: up to date     Preventive Care 65 Years and Older, Male Preventive care refers to lifestyle choices and visits with your health care provider that can promote health and wellness. What does preventive care include? A yearly physical exam. This is also called an annual well check. Dental exams once or twice a year. Routine eye exams. Ask your health care provider how often you should have your eyes checked. Personal lifestyle choices, including: Daily care of your teeth and gums. Regular physical activity. Eating a healthy diet. Avoiding tobacco and drug use. Limiting alcohol use. Practicing safe sex. Taking low doses of aspirin every day. Taking vitamin and mineral supplements as recommended by your health care provider. What happens during an annual well check? The services and screenings done by your health care provider during your annual well check will depend on your age, overall health, lifestyle risk factors, and family history of disease. Counseling  Your health care provider may ask you questions about your: Alcohol use. Tobacco use. Drug use. Emotional well-being. Home and relationship well-being. Sexual activity. Eating habits. History of falls. Memory and ability to understand (cognition). Work and work Astronomer. Screening  You may have the following tests or measurements: Height, weight, and BMI. Blood  pressure. Lipid and cholesterol levels. These may be checked every 5 years, or more frequently if you are over 76 years old. Skin check. Lung cancer screening. You may have this screening every year starting at age 57 if you have a 30-pack-year history of smoking and currently smoke or have quit within the past 15 years. Fecal occult blood test (FOBT) of the stool. You may have this test every year starting at age 49. Flexible sigmoidoscopy or colonoscopy. You may have a sigmoidoscopy every 5 years or a colonoscopy every 10 years starting at age 79. Prostate cancer screening. Recommendations will vary depending on your family history and other risks. Hepatitis C blood test. Hepatitis B blood test. Sexually transmitted disease (STD) testing. Diabetes screening. This is done by checking your blood sugar (glucose) after you have not eaten for a while (fasting). You may have this done every 1-3 years. Abdominal aortic aneurysm (AAA) screening. You may need this if you are a current or former smoker. Osteoporosis. You may be screened starting at age 6 if you are at high risk. Talk with your health care provider about your test results, treatment options, and if necessary, the need for more tests. Vaccines  Your health care provider may recommend certain vaccines, such as: Influenza vaccine. This is recommended every year. Tetanus, diphtheria, and acellular pertussis (Tdap, Td) vaccine. You may need a Td booster every 10 years. Zoster vaccine. You may need this after age 49. Pneumococcal 13-valent conjugate (PCV13) vaccine. One dose is recommended after age 66. Pneumococcal polysaccharide (PPSV23) vaccine. One dose is recommended after age 20. Talk to your health care provider about which screenings and vaccines you need and how often you need them. This information is not intended to replace advice given to you  by your health care provider. Make sure you discuss any questions you have with your  health care provider. Document Released: 08/14/2015 Document Revised: 04/06/2016 Document Reviewed: 05/19/2015 Elsevier Interactive Patient Education  2017 [employer].  Fall Prevention in the Home Falls can cause injuries. They can happen to people of all ages. There are many things you can do to make your home safe and to help prevent falls. What can I do on the outside of my home? Regularly fix the edges of walkways and driveways and fix any cracks. Remove anything that might make you trip as you walk through a door, such as a raised step or threshold. Trim any bushes or trees on the path to your home. Use bright outdoor lighting. Clear any walking paths of anything that might make someone trip, such as rocks or tools. Regularly check to see if handrails are loose or broken. Make sure that both sides of any steps have handrails. Any raised decks and porches should have guardrails on the edges. Have any leaves, snow, or ice cleared regularly. Use sand or salt on walking paths during winter. Clean up any spills in your garage right away. This includes oil or grease spills. What can I do in the bathroom? Use night lights. Install grab bars by the toilet and in the tub and shower. Do not use towel bars as grab bars. Use non-skid mats or decals in the tub or shower. If you need to sit down in the shower, use a plastic, non-slip stool. Keep the floor dry. Clean up any water that spills on the floor as soon as it happens. Remove soap buildup in the tub or shower regularly. Attach bath mats securely with double-sided non-slip rug tape. Do not have throw rugs and other things on the floor that can make you trip. What can I do in the bedroom? Use night lights. Make sure that you have a light by your bed that is easy to reach. Do not use any sheets or blankets that are too big for your bed. They should not hang down onto the floor. Have a firm chair that has side arms. You can use this for  support while you get dressed. Do not have throw rugs and other things on the floor that can make you trip. What can I do in the kitchen? Clean up any spills right away. Avoid walking on wet floors. Keep items that you use a lot in easy-to-reach places. If you need to reach something above you, use a strong step stool that has a grab bar. Keep electrical cords out of the way. Do not use floor polish or wax that makes floors slippery. If you must use wax, use non-skid floor wax. Do not have throw rugs and other things on the floor that can make you trip. What can I do with my stairs? Do not leave any items on the stairs. Make sure that there are handrails on both sides of the stairs and use them. Fix handrails that are broken or loose. Make sure that handrails are as long as the stairways. Check any carpeting to make sure that it is firmly attached to the stairs. Fix any carpet that is loose or worn. Avoid having throw rugs at the top or bottom of the stairs. If you do have throw rugs, attach them to the floor with carpet tape. Make sure that you have a light switch at the top of the stairs and the bottom of the stairs. If  you do not have them, ask someone to add them for you. What else can I do to help prevent falls? Wear shoes that: Do not have high heels. Have rubber bottoms. Are comfortable and fit you well. Are closed at the toe. Do not wear sandals. If you use a stepladder: Make sure that it is fully opened. Do not climb a closed stepladder. Make sure that both sides of the stepladder are locked into place. Ask someone to hold it for you, if possible. Clearly mark and make sure that you can see: Any grab bars or handrails. First and last steps. Where the edge of each step is. Use tools that help you move around (mobility aids) if they are needed. These include: Canes. Walkers. Scooters. Crutches. Turn on the lights when you go into a dark area. Replace any light bulbs as soon  as they burn out. Set up your furniture so you have a clear path. Avoid moving your furniture around. If any of your floors are uneven, fix them. If there are any pets around you, be aware of where they are. Review your medicines with your doctor. Some medicines can make you feel dizzy. This can increase your chance of falling. Ask your doctor what other things that you can do to help prevent falls. This information is not intended to replace advice given to you by your health care provider. Make sure you discuss any questions you have with your health care provider. Document Released: 05/14/2009 Document Revised: 12/24/2015 Document Reviewed: 08/22/2014 Elsevier Interactive Patient Education  2017 [employer].

## 2024-05-02 ENCOUNTER — Other Ambulatory Visit: Payer: Self-pay | Admitting: Family Medicine

## 2024-06-10 ENCOUNTER — Other Ambulatory Visit

## 2024-06-10 DIAGNOSIS — E118 Type 2 diabetes mellitus with unspecified complications: Secondary | ICD-10-CM

## 2024-06-10 DIAGNOSIS — R350 Frequency of micturition: Secondary | ICD-10-CM | POA: Diagnosis not present

## 2024-06-10 DIAGNOSIS — N401 Enlarged prostate with lower urinary tract symptoms: Secondary | ICD-10-CM | POA: Diagnosis not present

## 2024-06-10 DIAGNOSIS — Z13220 Encounter for screening for lipoid disorders: Secondary | ICD-10-CM

## 2024-06-10 DIAGNOSIS — R739 Hyperglycemia, unspecified: Secondary | ICD-10-CM | POA: Diagnosis not present

## 2024-06-11 ENCOUNTER — Ambulatory Visit: Payer: Self-pay | Admitting: Family Medicine

## 2024-06-13 LAB — COMPLETE METABOLIC PANEL WITHOUT GFR
AG Ratio: 2.2 (calc) (ref 1.0–2.5)
ALT: 13 U/L (ref 9–46)
AST: 9 U/L — ABNORMAL LOW (ref 10–35)
Albumin: 4.7 g/dL (ref 3.6–5.1)
Alkaline phosphatase (APISO): 63 U/L (ref 35–144)
BUN/Creatinine Ratio: 24 (calc) — ABNORMAL HIGH (ref 6–22)
BUN: 27 mg/dL — ABNORMAL HIGH (ref 7–25)
CO2: 27 mmol/L (ref 20–32)
Calcium: 9.4 mg/dL (ref 8.6–10.3)
Chloride: 98 mmol/L (ref 98–110)
Creat: 1.13 mg/dL (ref 0.70–1.28)
Globulin: 2.1 g/dL (ref 1.9–3.7)
Glucose, Bld: 162 mg/dL — ABNORMAL HIGH (ref 65–99)
Potassium: 3.8 mmol/L (ref 3.5–5.3)
Sodium: 136 mmol/L (ref 135–146)
Total Bilirubin: 0.7 mg/dL (ref 0.2–1.2)
Total Protein: 6.8 g/dL (ref 6.1–8.1)

## 2024-06-13 LAB — LIPID PANEL
Cholesterol: 138 mg/dL (ref <200)
HDL: 40 mg/dL (ref >=40)
LDL Cholesterol (Calc): 73 mg/dL
Non-HDL Cholesterol (Calc): 98 mg/dL (ref <130)
Total CHOL/HDL Ratio: 3.5 (calc) (ref <5.0)
Triglycerides: 186 mg/dL — ABNORMAL HIGH (ref <150)

## 2024-06-13 LAB — CBC WITH DIFFERENTIAL/PLATELET
Absolute Lymphocytes: 1218 {cells}/uL (ref 850–3900)
Absolute Monocytes: 499 {cells}/uL (ref 200–950)
Basophils Absolute: 41 {cells}/uL (ref 0–200)
Basophils Relative: 0.7 %
Eosinophils Absolute: 93 {cells}/uL (ref 15–500)
Eosinophils Relative: 1.6 %
HCT: 47 % (ref 38.5–50.0)
Hemoglobin: 15.9 g/dL (ref 13.2–17.1)
MCH: 28.3 pg (ref 27.0–33.0)
MCHC: 33.8 g/dL (ref 32.0–36.0)
MCV: 83.6 fL (ref 80.0–100.0)
MPV: 10.8 fL (ref 7.5–12.5)
Monocytes Relative: 8.6 %
Neutro Abs: 3950 {cells}/uL (ref 1500–7800)
Neutrophils Relative %: 68.1 %
Platelets: 201 Thousand/uL (ref 140–400)
RBC: 5.62 Million/uL (ref 4.20–5.80)
RDW: 13.6 % (ref 11.0–15.0)
Total Lymphocyte: 21 %
WBC: 5.8 Thousand/uL (ref 3.8–10.8)

## 2024-06-13 LAB — HEMOGLOBIN A1C
Hgb A1c MFr Bld: 7.4 % — ABNORMAL HIGH (ref <5.7)
Mean Plasma Glucose: 166 mg/dL
eAG (mmol/L): 9.2 mmol/L

## 2024-06-13 LAB — TEST AUTHORIZATION

## 2024-06-13 LAB — PSA: PSA: 3.29 ng/mL (ref <=4.00)

## 2024-06-26 ENCOUNTER — Other Ambulatory Visit: Payer: Self-pay | Admitting: Family Medicine

## 2024-06-26 DIAGNOSIS — E118 Type 2 diabetes mellitus with unspecified complications: Secondary | ICD-10-CM

## 2024-07-02 ENCOUNTER — Encounter: Payer: Self-pay | Admitting: Family Medicine

## 2024-07-02 ENCOUNTER — Ambulatory Visit: Admitting: Family Medicine

## 2024-07-02 VITALS — BP 150/82 | HR 91 | Temp 98.0°F | Ht 68.0 in | Wt 185.0 lb

## 2024-07-02 DIAGNOSIS — I152 Hypertension secondary to endocrine disorders: Secondary | ICD-10-CM | POA: Diagnosis not present

## 2024-07-02 DIAGNOSIS — E1159 Type 2 diabetes mellitus with other circulatory complications: Secondary | ICD-10-CM | POA: Diagnosis not present

## 2024-07-02 DIAGNOSIS — Z7984 Long term (current) use of oral hypoglycemic drugs: Secondary | ICD-10-CM | POA: Diagnosis not present

## 2024-07-02 MED ORDER — PIOGLITAZONE HCL 30 MG PO TABS: 30.0000 mg | ORAL_TABLET | Freq: Every day | ORAL | 3 refills | Status: AC

## 2024-07-02 NOTE — Progress Notes (Signed)
 Subjective:    Patient ID: Carlos Medina, male    DOB: Dec 22, 1950, 73 y.o.   MRN: [id number]  HPI  Patient has a history of diabetes mellitus type 2.  His most recent hemoglobin A1c was 7.4.  He is currently on Jardiance  25 mg a day.  He is here today to discuss other options to lower his A1c.  He has no history of heart failure.  He has no history of bladder cancer. Lab on 06/10/2024  Component Date Value Ref Range Status   WBC 06/10/2024 5.8  3.8 - 10.8 Thousand/uL Final   RBC 06/10/2024 5.62  4.20 - 5.80 Million/uL Final   Hemoglobin 06/10/2024 15.9  13.2 - 17.1 g/dL Final   HCT 88/89/7974 47.0  38.5 - 50.0 % Final   MCV 06/10/2024 83.6  80.0 - 100.0 fL Final   MCH 06/10/2024 28.3  27.0 - 33.0 pg Final   MCHC 06/10/2024 33.8  32.0 - 36.0 g/dL Final   Comment: For adults, a slight decrease in the calculated MCHC value (in the range of 30 to 32 g/dL) is most likely not clinically significant; however, it should be interpreted with caution in correlation with other red cell parameters and the patient's clinical condition.    RDW 06/10/2024 13.6  11.0 - 15.0 % Final   Platelets 06/10/2024 201  140 - 400 Thousand/uL Final   MPV 06/10/2024 10.8  7.5 - 12.5 fL Final   Neutro Abs 06/10/2024 3,950  1,500 - 7,800 cells/uL Final   Absolute Lymphocytes 06/10/2024 1,218  850 - 3,900 cells/uL Final   Absolute Monocytes 06/10/2024 499  200 - 950 cells/uL Final   Eosinophils Absolute 06/10/2024 93  15 - 500 cells/uL Final   Basophils Absolute 06/10/2024 41  0 - 200 cells/uL Final   Neutrophils Relative % 06/10/2024 68.1  % Final   Total Lymphocyte 06/10/2024 21.0  % Final   Monocytes Relative 06/10/2024 8.6  % Final   Eosinophils Relative 06/10/2024 1.6  % Final   Basophils Relative 06/10/2024 0.7  % Final   Glucose, Bld 06/10/2024 162 (H)  65 - 99 mg/dL Final   Comment: .            Fasting reference interval . For someone without known diabetes, a glucose value >125 mg/dL indicates  that they may have diabetes and this should be confirmed with a follow-up test. .    BUN 06/10/2024 27 (H)  7 - 25 mg/dL Final   Creat 88/89/7974 1.13  0.70 - 1.28 mg/dL Final   BUN/Creatinine Ratio 06/10/2024 24 (H)  6 - 22 (calc) Final   Sodium 06/10/2024 136  135 - 146 mmol/L Final   Potassium 06/10/2024 3.8  3.5 - 5.3 mmol/L Final   Chloride 06/10/2024 98  98 - 110 mmol/L Final   CO2 06/10/2024 27  20 - 32 mmol/L Final   Calcium  06/10/2024 9.4  8.6 - 10.3 mg/dL Final   Total Protein 88/89/7974 6.8  6.1 - 8.1 g/dL Final   Albumin 88/89/7974 4.7  3.6 - 5.1 g/dL Final   Globulin 88/89/7974 2.1  1.9 - 3.7 g/dL (calc) Final   AG Ratio 06/10/2024 2.2  1.0 - 2.5 (calc) Final   Total Bilirubin 06/10/2024 0.7  0.2 - 1.2 mg/dL Final   Alkaline phosphatase (APISO) 06/10/2024 63  35 - 144 U/L Final   AST 06/10/2024 9 (L)  10 - 35 U/L Final   ALT 06/10/2024 13  9 - 46 U/L Final  Cholesterol 06/10/2024 138  <200 mg/dL Final   HDL 88/89/7974 40  > OR = 40 mg/dL Final   Triglycerides 88/89/7974 186 (H)  <150 mg/dL Final   LDL Cholesterol (Calc) 06/10/2024 73  mg/dL (calc) Final   Comment: Reference range: <100 . Desirable range <100 mg/dL for primary prevention;   <70 mg/dL for patients with CHD or diabetic patients  with > or = 2 CHD risk factors. SABRA LDL-C is now calculated using the Martin-Hopkins  calculation, which is a validated novel method providing  better accuracy than the Friedewald equation in the  estimation of LDL-C.  Gladis APPLETHWAITE et al. SANDREA. 7986;689(80): 2061-2068  (http://education.[facility].com/faq/FAQ164)    Total CHOL/HDL Ratio 06/10/2024 3.5  <4.9 (calc) Final   Non-HDL Cholesterol (Calc) 06/10/2024 98  <130 mg/dL (calc) Final   Comment: For patients with diabetes plus 1 major ASCVD risk  factor, treating to a non-HDL-C goal of <100 mg/dL  (LDL-C of <29 mg/dL) is considered a therapeutic  option.    PSA 06/10/2024 3.29  < OR = 4.00 ng/mL Final   Comment: The  total PSA value from this assay system is  standardized against the WHO standard. The test  result will be approximately 20% lower when compared  to the equimolar-standardized total PSA (Beckman  Coulter). Comparison of serial PSA results should be  interpreted with this fact in mind. . This test was performed using the Siemens  chemiluminescent method. Values obtained from  different assay methods cannot be used interchangeably. PSA levels, regardless of value, should not be interpreted as absolute evidence of the presence or absence of disease.    Hgb A1c MFr Bld 06/10/2024 7.4 (H)  <5.7 % Final   Comment: For someone without known diabetes, a hemoglobin A1c value of 6.5% or greater indicates that they may have  diabetes and this should be confirmed with a follow-up  test. . For someone with known diabetes, a value <7% indicates  that their diabetes is well controlled and a value  greater than or equal to 7% indicates suboptimal  control. A1c targets should be individualized based on  duration of diabetes, age, comorbid conditions, and  other considerations. . Currently, no consensus exists regarding use of hemoglobin A1c for diagnosis of diabetes for children. .    Mean Plasma Glucose 06/10/2024 166  mg/dL Final   eAG (mmol/L) 88/89/7974 9.2  mmol/L Final   TEST NAME: 06/10/2024 HEMOGLOBIN A1c WITH eAG   Final   TEST CODE: 06/10/2024 [id number]   Final   CLIENT CONTACT: 06/10/2024 SUZEN RUMPS   Final   REPORT ALWAYS MESSAGE SIGNATURE 06/10/2024    Final   Comment: . The laboratory testing on this patient was verbally requested or confirmed by the ordering physician or his or her authorized representative after contact with an employee of [employer]. Federal regulations require that we maintain on file written authorization for all laboratory testing.  Accordingly we are asking that the ordering physician or his or her authorized representative sign a copy of  this report and promptly return it to the client service representative. . . Signature:____________________________________________________ . Please fax this signed page to [phone] or return it via your [employer] courier.     Past Medical History:  Diagnosis Date   Diabetes mellitus without complication (HCC)    Hyperlipidemia    Hypertension      Current Outpatient Medications on File Prior to Visit  Medication Sig Dispense Refill   Accu-Chek Softclix Lancets lancets USE  TO TEST BLOOD SUGAR TWICE DAILY     aspirin EC 81 MG tablet Take by mouth.     doxazosin (CARDURA) 8 MG tablet Take 8 mg by mouth at bedtime.     glucose blood (ACCU-CHEK GUIDE TEST) test strip Use to check blood sugars up to 4 times per day. 100 each 12   hydrochlorothiazide  (HYDRODIURIL ) 25 MG tablet Take 1 tablet (25 mg total) by mouth daily. 90 tablet 3   JARDIANCE  25 MG TABS tablet TAKE 1 TABLET(25 MG) BY MOUTH DAILY 90 tablet 1   losartan  (COZAAR ) 100 MG tablet TAKE 1 TABLET(100 MG) BY MOUTH DAILY 90 tablet 3   Multiple Vitamin (MULTI-VITAMIN) tablet Take 1 tablet by mouth daily.     rosuvastatin  (CRESTOR ) 10 MG tablet Take 1 tablet (10 mg total) by mouth daily. 90 tablet 3   sildenafil (VIAGRA) 100 MG tablet Take by mouth.     No current facility-administered medications on file prior to visit.   Allergies  Allergen Reactions   Albuterol Other (See Comments)    makes me bounce off the wall   Metformin And Related     Fatigue diarrhea   Social History   Socioeconomic History   Marital status: Married    Spouse name: Not on file   Number of children: Not on file   Years of education: Not on file   Highest education level: Bachelor's degree (e.g., BA, AB, BS)  Occupational History   Not on file  Tobacco Use   Smoking status: Never   Smokeless tobacco: Never  Substance and Sexual Activity   Alcohol use: Not on file   Drug use: Not on file   Sexual activity: Not on file   Other Topics Concern   Not on file  Social History Narrative   Retired runner, broadcasting/film/video    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    Social Drivers of Health   Financial Resource Strain: Low Risk  (06/27/2024)   Overall Financial Resource Strain (CARDIA)    Difficulty of Paying Living Expenses: Not hard at all  Food Insecurity: No Food Insecurity (06/27/2024)   Hunger Vital Sign    Worried About Running Out of Food in the Last Year: Never true    Ran Out of Food in the Last Year: Never true  Transportation Needs: No Transportation Needs (06/27/2024)   PRAPARE - Administrator, Civil Service (Medical): No    Lack of Transportation (Non-Medical): No  Physical Activity: Insufficiently Active (06/27/2024)   Exercise Vital Sign    Days of Exercise per Week: 3 days    Minutes of Exercise per Session: 20 min  Stress: No Stress Concern Present (06/27/2024)   [facility] [employer] - Occupational Stress Questionnaire    Feeling of Stress: Not at all  Social Connections: Moderately Integrated (06/27/2024)   Social Connection and Isolation Panel    Frequency of Communication with Friends and Family: More than three times a week    Frequency of Social Gatherings with Friends and Family: Once a week    Attends Religious Services: More than 4 times per year    Active Member of [employer] or Organizations: No    Attends Banker Meetings: Not on file    Marital Status: Married  Intimate Partner Violence: Not At Risk (04/18/2024)   Humiliation, Afraid, Rape, and Kick questionnaire    Fear of Current or Ex-Partner: No    Emotionally Abused: No    Physically  Abused: No    Sexually Abused: No     Review of Systems  All other systems reviewed and are negative.      Objective:   Physical Exam Vitals reviewed.  Constitutional:      Appearance: Normal appearance. He is normal weight.  HENT:     Right Ear: Tympanic membrane and ear canal normal.     Left  Ear: Tympanic membrane and ear canal normal.  Neck:     Vascular: No carotid bruit.  Cardiovascular:     Rate and Rhythm: Normal rate and regular rhythm.     Pulses: Normal pulses.     Heart sounds: Normal heart sounds. No murmur heard.    No friction rub. No gallop.  Pulmonary:     Effort: Pulmonary effort is normal. No respiratory distress.     Breath sounds: Normal breath sounds. No stridor. No wheezing, rhonchi or rales.  Abdominal:     General: Abdomen is flat. Bowel sounds are normal. There is no distension.     Palpations: Abdomen is soft.     Tenderness: There is no abdominal tenderness. There is no guarding or rebound.  Musculoskeletal:     Right lower leg: No edema.     Left lower leg: No edema.  Neurological:     General: No focal deficit present.     Mental Status: He is alert and oriented to person, place, and time. Mental status is at baseline.     Cranial Nerves: No cranial nerve deficit.           Assessment & Plan:  Hypertension associated with type 2 diabetes mellitus (HCC) Add Actos 30 mg daily to Jardiance  and recheck hemoglobin A1c in 6 months.  Blood pressure is elevated today with a history of whitecoat syndrome.  Encouraged the patient to monitor blood pressure at home and if consistently greater than 140/90 we need to add additional medication to lower his blood pressure

## 2024-12-31 ENCOUNTER — Other Ambulatory Visit

## 2025-04-24 ENCOUNTER — Ambulatory Visit
